# Patient Record
Sex: Female | Born: 1990 | Race: Black or African American | Hispanic: No | Marital: Single | Smoking: Never smoker
Health system: Southern US, Community
[De-identification: ages and names within clinical notes are randomized; demographics above are authoritative.]

## PROBLEM LIST (undated history)

## (undated) ENCOUNTER — Inpatient Hospital Stay (HOSPITAL_COMMUNITY): Payer: Self-pay

## (undated) DIAGNOSIS — D649 Anemia, unspecified: Secondary | ICD-10-CM

## (undated) HISTORY — PX: DILATION AND CURETTAGE OF UTERUS: SHX78

---
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
## 2010-04-27 ENCOUNTER — Inpatient Hospital Stay (HOSPITAL_COMMUNITY)
Admission: AD | Admit: 2010-04-27 | Discharge: 2010-04-27 | Disposition: A | Discharge status: Home / Self Care | Payer: Self-pay | Source: Ambulatory Visit | Attending: Obstetrics & Gynecology | Admitting: Obstetrics & Gynecology

## 2010-04-27 DIAGNOSIS — N760 Acute vaginitis: Secondary | ICD-10-CM | POA: Insufficient documentation

## 2010-04-27 DIAGNOSIS — N949 Unspecified condition associated with female genital organs and menstrual cycle: Secondary | ICD-10-CM

## 2010-04-27 LAB — POCT PREGNANCY, URINE: Preg Test, Ur: NEGATIVE

## 2010-04-27 LAB — WET PREP, GENITAL: Yeast Wet Prep HPF POC: NONE SEEN

## 2010-04-28 LAB — GC/CHLAMYDIA PROBE AMP, GENITAL
Chlamydia, DNA Probe: NEGATIVE
GC Probe Amp, Genital: NEGATIVE

## 2010-10-08 ENCOUNTER — Inpatient Hospital Stay (INDEPENDENT_AMBULATORY_CARE_PROVIDER_SITE_OTHER)
Admission: RE | Admit: 2010-10-08 | Discharge: 2010-10-08 | Disposition: A | Discharge status: Home / Self Care | Payer: Self-pay | Source: Ambulatory Visit | Attending: Family Medicine | Admitting: Family Medicine

## 2010-10-08 DIAGNOSIS — N3090 Cystitis, unspecified without hematuria: Secondary | ICD-10-CM

## 2010-10-08 LAB — POCT URINALYSIS DIP (DEVICE)
Bilirubin Urine: NEGATIVE
Glucose, UA: NEGATIVE mg/dL
Ketones, ur: NEGATIVE mg/dL
Nitrite: POSITIVE — AB

## 2010-10-08 LAB — POCT PREGNANCY, URINE: Preg Test, Ur: NEGATIVE

## 2010-10-17 ENCOUNTER — Inpatient Hospital Stay (INDEPENDENT_AMBULATORY_CARE_PROVIDER_SITE_OTHER)
Admission: RE | Admit: 2010-10-17 | Discharge: 2010-10-17 | Disposition: A | Discharge status: Home / Self Care | Payer: Self-pay | Source: Ambulatory Visit | Attending: Family Medicine | Admitting: Family Medicine

## 2010-10-17 DIAGNOSIS — N760 Acute vaginitis: Secondary | ICD-10-CM

## 2010-10-17 DIAGNOSIS — A499 Bacterial infection, unspecified: Secondary | ICD-10-CM

## 2010-10-17 LAB — POCT URINALYSIS DIP (DEVICE)
Bilirubin Urine: NEGATIVE
Glucose, UA: NEGATIVE mg/dL
Ketones, ur: NEGATIVE mg/dL
Nitrite: NEGATIVE
pH: 7 (ref 5.0–8.0)

## 2010-10-17 LAB — WET PREP, GENITAL

## 2010-10-20 LAB — GC/CHLAMYDIA PROBE AMP, GENITAL
Chlamydia, DNA Probe: POSITIVE — AB
GC Probe Amp, Genital: NEGATIVE

## 2011-05-06 ENCOUNTER — Encounter (HOSPITAL_COMMUNITY): Payer: Self-pay | Admitting: Emergency Medicine

## 2011-05-06 ENCOUNTER — Emergency Department (HOSPITAL_COMMUNITY)
Admission: EM | Admit: 2011-05-06 | Discharge: 2011-05-06 | Disposition: A | Discharge status: Home / Self Care | Payer: Self-pay | Attending: Emergency Medicine | Admitting: Emergency Medicine

## 2011-05-06 DIAGNOSIS — N760 Acute vaginitis: Secondary | ICD-10-CM | POA: Insufficient documentation

## 2011-05-06 DIAGNOSIS — A499 Bacterial infection, unspecified: Secondary | ICD-10-CM | POA: Insufficient documentation

## 2011-05-06 DIAGNOSIS — B9689 Other specified bacterial agents as the cause of diseases classified elsewhere: Secondary | ICD-10-CM | POA: Insufficient documentation

## 2011-05-06 LAB — URINALYSIS, ROUTINE W REFLEX MICROSCOPIC
Bilirubin Urine: NEGATIVE
Nitrite: NEGATIVE
Specific Gravity, Urine: 1.023 (ref 1.005–1.030)
Urobilinogen, UA: 1 mg/dL (ref 0.0–1.0)
pH: 6 (ref 5.0–8.0)

## 2011-05-06 LAB — POCT PREGNANCY, URINE: Preg Test, Ur: NEGATIVE

## 2011-05-06 LAB — URINE MICROSCOPIC-ADD ON

## 2011-05-06 LAB — WET PREP, GENITAL: Trich, Wet Prep: NONE SEEN

## 2011-05-06 MED ORDER — CEFTRIAXONE SODIUM 250 MG IJ SOLR
250.0000 mg | INTRAMUSCULAR | Status: DC
  Administered 2011-05-06: 250 mg via INTRAMUSCULAR
  Filled 2011-05-06: qty 250

## 2011-05-06 MED ORDER — LIDOCAINE HCL (PF) 1 % IJ SOLN
INTRAMUSCULAR | Status: AC
  Administered 2011-05-06: 06:00:00
  Filled 2011-05-06: qty 5

## 2011-05-06 MED ORDER — METRONIDAZOLE 500 MG PO TABS: 500.0000 mg | ORAL_TABLET | Freq: Two times a day (BID) | ORAL | Status: AC

## 2011-05-06 MED ORDER — AZITHROMYCIN 250 MG PO TABS
1000.0000 mg | ORAL_TABLET | Freq: Once | ORAL | Status: AC
  Administered 2011-05-06: 1000 mg via ORAL
  Filled 2011-05-06: qty 4

## 2011-05-06 NOTE — ED Notes (Signed)
PT. REPORTS VAGINAL DISCHARGE/ IRRITATION WITH STRONG ODOR FOR SEVERAL DAYS .

## 2011-05-06 NOTE — Discharge Instructions (Signed)
You have been treated in the emergency department to cover for potential gonorrhea or Chlamydia infection. The hospital will call you if your result comes back positive for gonorrhea or Chlamydia. Wet prep does show bacterial vaginosis. It is an overgrowth of your own natural bacteria within your vaginal vault. Take Flagyl as directed for bacterial vaginosis. Do not drink with Flagyl use or you will vomit. Followup with the [facility] STD clinic for future concerns of STD. Return to emergency department for emergent changing or worsening symptoms.  Bacterial Vaginosis Bacterial vaginosis (BV) is a vaginal infection where the normal balance of bacteria in the vagina is disrupted. The normal balance is then replaced by an overgrowth of certain bacteria. There are several different kinds of bacteria that can cause BV. BV is the most common vaginal infection in women of childbearing age. CAUSES   The cause of BV is not fully understood. BV develops when there is an increase or imbalance of harmful bacteria.   Some activities or behaviors can upset the normal balance of bacteria in the vagina and put women at increased risk including:   Having a new sex partner or multiple sex partners.   Douching.   Using an intrauterine device (IUD) for contraception.   It is not clear what role sexual activity plays in the development of BV. However, women that have never had sexual intercourse are rarely infected with BV.  Women do not get BV from toilet seats, bedding, swimming pools or from touching objects around them.  SYMPTOMS   Grey vaginal discharge.   A fish-like odor with discharge, especially after sexual intercourse.   Itching or burning of the vagina and vulva.   Burning or pain with urination.   Some women have no signs or symptoms at all.  DIAGNOSIS  Your caregiver must examine the vagina for signs of BV. Your caregiver will perform lab tests and look at the sample of vaginal fluid  through a microscope. They will look for bacteria and abnormal cells (clue cells), a pH test higher than 4.5, and a positive amine test all associated with BV.  RISKS AND COMPLICATIONS   Pelvic inflammatory disease (PID).   Infections following gynecology surgery.   Developing HIV.   Developing herpes virus.  TREATMENT  Sometimes BV will clear up without treatment. However, all women with symptoms of BV should be treated to avoid complications, especially if gynecology surgery is planned. Female partners generally do not need to be treated. However, BV may spread between female sex partners so treatment is helpful in preventing a recurrence of BV.   BV may be treated with antibiotics. The antibiotics come in either pill or vaginal cream forms. Either can be used with nonpregnant or pregnant women, but the recommended dosages differ. These antibiotics are not harmful to the baby.   BV can recur after treatment. If this happens, a second round of antibiotics will often be prescribed.   Treatment is important for pregnant women. If not treated, BV can cause a premature delivery, especially for a pregnant woman who had a premature birth in the past. All pregnant women who have symptoms of BV should be checked and treated.   For chronic reoccurrence of BV, treatment with a type of prescribed gel vaginally twice a week is helpful.  HOME CARE INSTRUCTIONS   Finish all medication as directed by your caregiver.   Do not have sex until treatment is completed.   Tell your sexual partner that you have a  vaginal infection. They should see their caregiver and be treated if they have problems, such as a mild rash or itching.   Practice safe sex. Use condoms. Only have 1 sex partner.  PREVENTION  Basic prevention steps can help reduce the risk of upsetting the natural balance of bacteria in the vagina and developing BV:  Do not have sexual intercourse (be abstinent).   Do not douche.   Use all of  the medicine prescribed for treatment of BV, even if the signs and symptoms go away.   Tell your sex partner if you have BV. That way, they can be treated, if needed, to prevent reoccurrence.  SEEK MEDICAL CARE IF:   Your symptoms are not improving after 3 days of treatment.   You have increased discharge, pain, or fever.  MAKE SURE YOU:   Understand these instructions.   Will watch your condition.   Will get help right away if you are not doing well or get worse.  FOR MORE INFORMATION  Division of STD Prevention (DSTDP), Centers for Disease Control and Prevention: [employer] American Social Health Association (ASHA): www.ashastd.org  Document Released: 01/12/2005 Document Revised: 01/01/2011 Document Reviewed: 07/05/2008 [facility] Patient Information 2012 [place], [place].

## 2011-05-06 NOTE — ED Provider Notes (Signed)
History     CSN: [id number]  Arrival date & time 05/06/11  6295   First MD Initiated Contact with Patient 05/06/11 [phone]      Chief Complaint  Patient presents with  . Vaginal Discharge    (Consider location/radiation/quality/duration/timing/severity/associated sxs/prior treatment) HPI  Patient presents to emergency department complaining of a one-week history of increased vaginal discharge with some vaginal irritation and a strong odor to the discharge. Patient denies any vaginal lesions. She denies abdominal pain, nausea, vomiting, diarrhea, dysuria, hematuria, or pelvic pain. Patient states she sexually active with multiple partners doesn't always use protection. Patient's concerned for potential STD. Patient is G0 P0. She denies fevers and denies chills. Patient denies history of STD. She denies dyspareunia. Symptoms are gradual onset, persistent, and unchanging. She's taken nothing for symptoms prior to arrival.  History reviewed. No pertinent past medical history.  History reviewed. No pertinent past surgical history.  No family history on file.  History  Substance Use Topics  . Smoking status: Never Smoker   . Smokeless tobacco: Not on file  . Alcohol Use: No    OB History    Grav Para Term Preterm Abortions TAB SAB Ect Mult Living                  Review of Systems  All other systems reviewed and are negative.    Allergies  Review of patient's allergies indicates no known allergies.  Home Medications  No current outpatient prescriptions on file.  BP 106/68  Pulse 73  Temp(Src) 98.4 F (36.9 C) (Oral)  Resp 19  SpO2 96%  LMP 04/17/2011  Physical Exam  Nursing note and vitals reviewed. Constitutional: She is oriented to person, place, and time. She appears well-developed and well-nourished. No distress.  HENT:  Head: Normocephalic and atraumatic.  Eyes: Conjunctivae are normal.  Neck: Normal range of motion. Neck supple.  Cardiovascular: Normal  rate, regular rhythm, normal heart sounds and intact distal pulses.  Exam reveals no gallop and no friction rub.   No murmur heard. Pulmonary/Chest: Effort normal and breath sounds normal. No respiratory distress. She has no wheezes. She has no rales. She exhibits no tenderness.  Abdominal: Soft. Bowel sounds are normal. She exhibits no distension and no mass. There is no tenderness. There is no rebound and no guarding.  Genitourinary: Vagina normal. There is no rash, tenderness or lesion on the right labia. There is no rash, tenderness or lesion on the left labia. Cervix exhibits discharge. Cervix exhibits no motion tenderness. Right adnexum displays no mass and no tenderness. Left adnexum displays no mass and no tenderness.       Moderate amount of malodorous white discharge from cervix  Musculoskeletal: Normal range of motion. She exhibits no edema and no tenderness.  Neurological: She is alert and oriented to person, place, and time.  Skin: Skin is warm and dry. No rash noted. She is not diaphoretic. No erythema.  Psychiatric: She has a normal mood and affect.    ED Course  Procedures (including critical care time)  IM rocephin and PO zithromax  Labs Reviewed  URINALYSIS, ROUTINE W REFLEX MICROSCOPIC - Abnormal; Notable for the following:    Leukocytes, UA SMALL (*)    All other components within normal limits  WET PREP, GENITAL - Abnormal; Notable for the following:    Clue Cells Wet Prep HPF POC MANY (*)    WBC, Wet Prep HPF POC MANY (*)    All other components within normal  limits  POCT PREGNANCY, URINE  URINE MICROSCOPIC-ADD ON  GC/CHLAMYDIA PROBE AMP, GENITAL   No results found.   1. Bacterial vaginosis       MDM  Patient is afebrile, nontoxic-appearing, abdomen soft nontender. No cervical motion tenderness or adnexal tenderness to palpation. Patient is at risk for STD given her sexual history with concern for a CD and with cervical discharge. Treated for gonorrhea and  Chlamydia in ER. No signs or symptoms of PID or TOA. Patient agreeable to following up with health department for further STD concerns        Jenness Corner, [place] 05/06/11 [phone]

## 2011-05-06 NOTE — ED Notes (Signed)
Pt to ED c/o white, "smelly" vaginal discharge x 1 week.  Pt denies abd/suprapubic/vaginal pain or pruritis.  States protected sexual activity 1 month prior.  LMPD 3 wks prior.

## 2011-05-06 NOTE — ED Provider Notes (Signed)
Medical screening examination/treatment/procedure(s) were performed by non-physician practitioner and as supervising physician I was immediately available for consultation/collaboration.  Jasmine Awe, MD 05/06/11 [phone]

## 2011-05-07 LAB — GC/CHLAMYDIA PROBE AMP, GENITAL: Chlamydia, DNA Probe: NEGATIVE

## 2013-01-14 ENCOUNTER — Encounter (HOSPITAL_COMMUNITY): Payer: Self-pay

## 2013-01-14 ENCOUNTER — Inpatient Hospital Stay (HOSPITAL_COMMUNITY)
Admission: AD | Admit: 2013-01-14 | Discharge: 2013-01-14 | Disposition: A | Discharge status: Home / Self Care | Payer: Medicaid Other | Source: Ambulatory Visit | Attending: Family Medicine | Admitting: Family Medicine

## 2013-01-14 DIAGNOSIS — M545 Low back pain, unspecified: Secondary | ICD-10-CM | POA: Insufficient documentation

## 2013-01-14 DIAGNOSIS — Z3202 Encounter for pregnancy test, result negative: Secondary | ICD-10-CM | POA: Insufficient documentation

## 2013-01-14 DIAGNOSIS — S39012A Strain of muscle, fascia and tendon of lower back, initial encounter: Secondary | ICD-10-CM

## 2013-01-14 DIAGNOSIS — S335XXA Sprain of ligaments of lumbar spine, initial encounter: Secondary | ICD-10-CM

## 2013-01-14 LAB — URINALYSIS, ROUTINE W REFLEX MICROSCOPIC
Glucose, UA: NEGATIVE mg/dL
Leukocytes, UA: NEGATIVE
Nitrite: NEGATIVE
pH: 6 (ref 5.0–8.0)

## 2013-01-14 MED ORDER — NORGESTIMATE-ETH ESTRADIOL 0.25-35 MG-MCG PO TABS: 1.0000 | ORAL_TABLET | Freq: Every day | ORAL | Status: DC

## 2013-01-14 NOTE — MAU Note (Signed)
Pt c/o lower back pain for several days now. Says that it feels like the pain she had when she last found out she was pregnant. States she had a period on December 8th, but it was a lot heavier than normal and she went through an entire box of tampons within 3-4 days.

## 2013-01-14 NOTE — MAU Provider Note (Signed)
History     CSN: [id number]  Arrival date and time: 01/14/13 1941   First Provider Initiated Contact with Patient 01/14/13 2011      Chief Complaint  Patient presents with  . Back Pain   HPI Brittney Mcfarland is a 22 y.o. G1P1001 presents with three-day history of right-sided back pain. Patient states this is similar to the first obtuse pregnant 1 be reevaluated. Patient testing Tylenol no improvement. Patient reports pain as 5/10 but is able to ambulate and sit up without issues. Patient has not had any urinary symptoms, constipation symptoms, fevers or chills.  Patient also reports that her period ended 1 week ago. Patient states that she was much heavier than usual and she went through 3 or 4 boxes of tampons as opposed worse usually 2 tampons/day. Patient currently is having unprotected sex but is not interested in conceiving a child.  OB History   Grav Para Term Preterm Abortions TAB SAB Ect Mult Living   1 1 0 1      1      History reviewed. No pertinent past medical history.  History reviewed. No pertinent past surgical history.  History reviewed. No pertinent family history.  History  Substance Use Topics  . Smoking status: Never Smoker   . Smokeless tobacco: Not on file  . Alcohol Use: No    Allergies: No Known Allergies  Prescriptions prior to admission  Medication Sig Dispense Refill  . acetaminophen (TYLENOL) 325 MG tablet Take 650 mg by mouth every 6 (six) hours as needed.      . Multiple Vitamin (MULTIVITAMIN) capsule Take 1 capsule by mouth daily.        ROS see above Physical Exam   Height 5\' 3"  (1.6 m), weight 80.196 kg (176 lb 12.8 oz), last menstrual period 01/02/2013.  Physical Exam  VSS NAD NO CVAT, no Suprapubic tenderness TTP along right Paraspinal. Improved with deep palpation and gentle massage. ~T4-T10. No c/c/e  Results for Brittney, Mcfarland (MRN [id number]) as of 01/14/2013 20:24  Ref. Range 01/14/2013 19:55 01/14/2013 20:11  Color, Urine  Latest Range: YELLOW  YELLOW   APPearance Latest Range: CLEAR  CLEAR   Specific Gravity, Urine Latest Range: 1.005-1.030  >1.030 (H)   pH Latest Range: 5.0-8.0  6.0   Glucose Latest Range: NEGATIVE mg/dL NEGATIVE   Bilirubin Urine Latest Range: NEGATIVE  NEGATIVE   Ketones, ur Latest Range: NEGATIVE mg/dL NEGATIVE   Protein Latest Range: NEGATIVE mg/dL NEGATIVE   Urobilinogen, UA Latest Range: 0.0-1.0 mg/dL 0.2   Nitrite Latest Range: NEGATIVE  NEGATIVE   Leukocytes, UA Latest Range: NEGATIVE  NEGATIVE   Hgb urine dipstick Latest Range: NEGATIVE  NEGATIVE   Preg Test, Ur Latest Range: NEGATIVE   NEGATIVE    MAU Course  Procedures  MDM Will check urine pregnancy test, and UA to evaluate for possible stone or infection.  Assessment and Plan  Brittney Mcfarland is a 22 y.o. G1P0101 presents paraspinal strain. Patient recommended to use heating pad, massage, Motrin 800 mg 3 times a day for 7 days. Encouraged take with food. No evidence of urinary tract infection or nephrolithiasis. Patient is not pregnant patient would like birth control. Patient does not have any contraindications. Patient denies migraine aura, history of DVTs, tobacco age of 24. Will give patient prescription for Sprintec and phone number for clinic to be followed up then. Patient would like long-term birth control may be a good candidate for the mirena.   Tawana Scale,  MD OB Fellow   Brittney Mcfarland, Audie Clear 01/14/2013, 8:18 PM

## 2013-01-15 NOTE — MAU Provider Note (Signed)
Chart reviewed and agree with management and plan.  

## 2013-05-01 ENCOUNTER — Inpatient Hospital Stay (HOSPITAL_COMMUNITY)
Admission: AD | Admit: 2013-05-01 | Discharge: 2013-05-01 | Discharge status: Against Medical Advice | Payer: Medicaid Other | Source: Ambulatory Visit | Attending: Obstetrics & Gynecology | Admitting: Obstetrics & Gynecology

## 2013-05-01 NOTE — MAU Note (Signed)
Patient is not in the lobby when called to triage.  

## 2013-05-01 NOTE — MAU Note (Signed)
Pt not in lobby at [employer]1952

## 2013-05-09 ENCOUNTER — Emergency Department (HOSPITAL_COMMUNITY)
Admission: EM | Admit: 2013-05-09 | Discharge: 2013-05-09 | Disposition: A | Discharge status: Home / Self Care | Payer: Medicaid Other | Source: Home / Self Care | Attending: Family Medicine | Admitting: Family Medicine

## 2013-05-09 ENCOUNTER — Encounter (HOSPITAL_COMMUNITY): Payer: Self-pay | Admitting: Emergency Medicine

## 2013-05-09 DIAGNOSIS — J329 Chronic sinusitis, unspecified: Secondary | ICD-10-CM

## 2013-05-09 MED ORDER — AMOXICILLIN-POT CLAVULANATE 875-125 MG PO TABS: 1.0000 | ORAL_TABLET | Freq: Two times a day (BID) | ORAL | Status: DC

## 2013-05-09 MED ORDER — METHYLPREDNISOLONE 4 MG PO KIT: PACK | ORAL | Status: DC

## 2013-05-09 MED ORDER — PSEUDOEPHEDRINE-IBUPROFEN 30-200 MG PO TABS: ORAL_TABLET | ORAL | Status: DC

## 2013-05-09 MED ORDER — FLUTICASONE PROPIONATE 50 MCG/ACT NA SUSP: 2.0000 | Freq: Two times a day (BID) | NASAL | Status: DC

## 2013-05-09 MED ORDER — OLOPATADINE HCL 0.2 % OP SOLN: OPHTHALMIC | Status: DC

## 2013-05-09 NOTE — ED Notes (Signed)
C/o sinus pressure and pain.  Itchy watery eyes. Sore throat.  Denies fever and any other symptoms.

## 2013-05-09 NOTE — ED Provider Notes (Signed)
CSN: [id number]     Arrival date & time 05/09/13  1348 History   First MD Initiated Contact with Patient 05/09/13 1451     Chief Complaint  Patient presents with  . Facial Pain   (Consider location/radiation/quality/duration/timing/severity/associated sxs/prior Treatment) HPI Comments: 23 year old female presents complaining of sinus pain and pressure, itchy watery eyes, sore throat, headache, present for 6 days. Her symptoms have been constant, not getting any better or worse. She has taken allergy medicine to try to treat this at home including Benadryl, Claritin, and Singulair. Nothing seems to be helping. She denies fever, chills, cough, shortness of breath, fatigue, NVD.   History reviewed. No pertinent past medical history. History reviewed. No pertinent past surgical history. History reviewed. No pertinent family history. History  Substance Use Topics  . Smoking status: Never Smoker   . Smokeless tobacco: Not on file  . Alcohol Use: No   OB History   Grav Para Term Preterm Abortions TAB SAB Ect Mult Living   1 1 0 1      1     Review of Systems  HENT: Positive for sinus pressure and sore throat.   Eyes: Positive for discharge and itching.  Neurological: Positive for headaches.  All other systems reviewed and are negative.   Allergies  Latex  Home Medications   Prior to Admission medications   Medication Sig Start Date End Date Taking? Authorizing Provider  acetaminophen (TYLENOL) 325 MG tablet Take 650 mg by mouth every 6 (six) hours as needed (back pain).    Historical Provider, MD  Multiple Vitamin (MULTIVITAMIN) capsule Take 1 capsule by mouth daily.    Historical Provider, MD  norgestimate-ethinyl estradiol (ORTHO-CYCLEN,SPRINTEC,PREVIFEM) 0.25-35 MG-MCG tablet Take 1 tablet by mouth daily. 01/14/13   Minta BalsamMichael R Odom, MD   BP 110/75  Pulse 82  Temp(Src) 97.7 F (36.5 C) (Oral)  Resp 16  SpO2 100%  LMP 04/22/2013 Physical Exam  Nursing note and vitals  reviewed. Constitutional: She is oriented to person, place, and time. Vital signs are normal. She appears well-developed and well-nourished. No distress.  HENT:  Head: Normocephalic and atraumatic.  Right Ear: Tympanic membrane, external ear and ear canal normal.  Left Ear: Tympanic membrane, external ear and ear canal normal.  Nose: Mucosal edema and rhinorrhea present. Right sinus exhibits maxillary sinus tenderness. Right sinus exhibits no frontal sinus tenderness. Left sinus exhibits maxillary sinus tenderness. Left sinus exhibits no frontal sinus tenderness.  Mouth/Throat: Uvula is midline, oropharynx is clear and moist and mucous membranes are normal.  Eyes:  Conjunctiva are glassy  Cardiovascular: Normal rate and regular rhythm.   Pulmonary/Chest: Effort normal. No respiratory distress.  Lymphadenopathy:    She has no cervical adenopathy.  Neurological: She is alert and oriented to person, place, and time. She has normal strength. Coordination normal.  Skin: Skin is warm and dry. No rash noted. She is not diaphoretic.  Psychiatric: She has a normal mood and affect. Judgment normal.    ED Course  Procedures (including critical care time) Labs Review Labs Reviewed - No data to display   Imaging Review No results found.   MDM   1. Rhinosinusitis    Probable allergies. Will treat accordingly. Postdated prescription for Augmentin. Followup when necessary if still not improving   Meds ordered this encounter  Medications  . methylPREDNISolone (MEDROL DOSEPAK) 4 MG tablet    Sig: Use as directed on package instructions    Dispense:  21 tablet    Refill:  0    Order Specific Question:  Supervising Provider    Answer:  Linna HoffKINDL, JAMES D [[id number]]  . fluticasone (FLONASE) 50 MCG/ACT nasal spray    Sig: Place 2 sprays into both nostrils 2 (two) times daily. Decrease to 2 sprays/nostril daily after 5 days    Dispense:  16 g    Refill:  2    Order Specific Question:  Supervising  Provider    Answer:  Linna HoffKINDL, JAMES D [phone][[id number]]  . Pseudoephedrine-Ibuprofen 30-200 MG TABS    Sig: 1-2 tabs PO Q6 hrs PRN    Dispense:  50 each    Refill:  1    Order Specific Question:  Supervising Provider    Answer:  Linna HoffKINDL, JAMES D [phone][[id number]]  . amoxicillin-clavulanate (AUGMENTIN) 875-125 MG per tablet    Sig: Take 1 tablet by mouth every 12 (twelve) hours.    Dispense:  14 tablet    Refill:  0    Order Specific Question:  Supervising Provider    Answer:  Linna HoffKINDL, JAMES D [phone][[id number]]  . Olopatadine HCl (PATADAY) 0.2 % SOLN    Sig: 1 drop per eye once daily as needed for redness, itching, or irritation    Dispense:  2.5 mL    Refill:  0    Order Specific Question:  Supervising Provider    Answer:  Bradd CanaryKINDL, JAMES D [[id number]]      Graylon GoodZachary H Garik Diamant, PA-C 05/09/13 [phone]

## 2013-05-09 NOTE — Discharge Instructions (Signed)

## 2013-05-12 NOTE — ED Provider Notes (Signed)
Medical screening examination/treatment/procedure(s) were performed by resident physician or non-physician practitioner and as supervising physician I was immediately available for consultation/collaboration.   Theora Vankirk DOUGLAS MD.   Loghan Kurtzman D Nakeitha Milligan, MD 05/12/13 1223 

## 2013-07-14 ENCOUNTER — Emergency Department (HOSPITAL_COMMUNITY): Payer: No Typology Code available for payment source

## 2013-07-14 ENCOUNTER — Emergency Department (HOSPITAL_COMMUNITY)
Admission: EM | Admit: 2013-07-14 | Discharge: 2013-07-14 | Disposition: A | Discharge status: Home / Self Care | Payer: No Typology Code available for payment source | Attending: Emergency Medicine | Admitting: Emergency Medicine

## 2013-07-14 ENCOUNTER — Encounter (HOSPITAL_COMMUNITY): Payer: Self-pay | Admitting: Emergency Medicine

## 2013-07-14 DIAGNOSIS — S63502A Unspecified sprain of left wrist, initial encounter: Secondary | ICD-10-CM

## 2013-07-14 DIAGNOSIS — Z91040 Latex allergy status: Secondary | ICD-10-CM | POA: Insufficient documentation

## 2013-07-14 DIAGNOSIS — Y92410 Unspecified street and highway as the place of occurrence of the external cause: Secondary | ICD-10-CM | POA: Insufficient documentation

## 2013-07-14 DIAGNOSIS — Z79899 Other long term (current) drug therapy: Secondary | ICD-10-CM | POA: Insufficient documentation

## 2013-07-14 DIAGNOSIS — S335XXA Sprain of ligaments of lumbar spine, initial encounter: Secondary | ICD-10-CM | POA: Insufficient documentation

## 2013-07-14 DIAGNOSIS — IMO0002 Reserved for concepts with insufficient information to code with codable children: Secondary | ICD-10-CM | POA: Insufficient documentation

## 2013-07-14 DIAGNOSIS — S39012A Strain of muscle, fascia and tendon of lower back, initial encounter: Secondary | ICD-10-CM

## 2013-07-14 DIAGNOSIS — Z792 Long term (current) use of antibiotics: Secondary | ICD-10-CM | POA: Insufficient documentation

## 2013-07-14 DIAGNOSIS — Y9389 Activity, other specified: Secondary | ICD-10-CM | POA: Insufficient documentation

## 2013-07-14 DIAGNOSIS — S63509A Unspecified sprain of unspecified wrist, initial encounter: Secondary | ICD-10-CM | POA: Insufficient documentation

## 2013-07-14 MED ORDER — DIAZEPAM 5 MG PO TABS: 5.0000 mg | ORAL_TABLET | Freq: Two times a day (BID) | ORAL | Status: DC

## 2013-07-14 MED ORDER — TRAMADOL HCL 50 MG PO TABS: 50.0000 mg | ORAL_TABLET | Freq: Four times a day (QID) | ORAL | Status: DC | PRN

## 2013-07-14 NOTE — ED Notes (Signed)
Splint applied to left wrist. CMS intact. Pt given discharge instructions. All questions identified.
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
## 2013-07-19 NOTE — ED Provider Notes (Signed)
Medical screening examination/treatment/procedure(s) were performed by non-physician practitioner and as supervising physician I was immediately available for consultation/collaboration.   EKG Interpretation None        Rolland PorterMark James, MD 07/19/13 [phone]

## 2013-11-27 ENCOUNTER — Encounter (HOSPITAL_COMMUNITY): Payer: Self-pay | Admitting: Emergency Medicine

## 2014-06-13 ENCOUNTER — Encounter (HOSPITAL_COMMUNITY): Payer: Self-pay | Admitting: Emergency Medicine

## 2014-06-13 ENCOUNTER — Emergency Department (HOSPITAL_COMMUNITY)
Admission: EM | Admit: 2014-06-13 | Discharge: 2014-06-13 | Disposition: A | Discharge status: Home / Self Care | Payer: Medicaid Other | Attending: Emergency Medicine | Admitting: Emergency Medicine

## 2014-06-13 DIAGNOSIS — Z7951 Long term (current) use of inhaled steroids: Secondary | ICD-10-CM | POA: Insufficient documentation

## 2014-06-13 DIAGNOSIS — Z79899 Other long term (current) drug therapy: Secondary | ICD-10-CM | POA: Insufficient documentation

## 2014-06-13 DIAGNOSIS — Z91040 Latex allergy status: Secondary | ICD-10-CM | POA: Insufficient documentation

## 2014-06-13 DIAGNOSIS — H00013 Hordeolum externum right eye, unspecified eyelid: Secondary | ICD-10-CM

## 2014-06-13 DIAGNOSIS — H00011 Hordeolum externum right upper eyelid: Secondary | ICD-10-CM | POA: Insufficient documentation

## 2014-06-13 DIAGNOSIS — Z792 Long term (current) use of antibiotics: Secondary | ICD-10-CM | POA: Insufficient documentation

## 2014-06-13 MED ORDER — BACITRACIN ZINC 500 UNIT/GM EX OINT
TOPICAL_OINTMENT | CUTANEOUS | Status: AC
  Administered 2014-06-13: 16:00:00
  Filled 2014-06-13: qty 0.9

## 2014-06-13 MED ORDER — MUPIROCIN CALCIUM 2 % EX CREA: TOPICAL_CREAM | CUTANEOUS | Status: DC

## 2014-06-13 NOTE — ED Notes (Signed)
Pt has what appears to be a reddened swollen bump on right eyelid. Says "it just appeared 2 days ago." Denies any other c/c. Pain when opening eyelid.

## 2014-06-13 NOTE — ED Provider Notes (Signed)
CSN: [id number]     Arrival date & time 06/13/14  1433 History   This chart was scribed for non-physician practitioner Vangie BickerErin O' [place] MorelleMalley, PA-C, working with Mirian MoMatthew Gentry, MD, by Andrew Auaven Small, ED Scribe. This patient was seen in room WTR5/WTR5 and the patient's care was started at 3:06 PM.  Chief Complaint  Patient presents with  . Eye Pain  . Eyelid wound    The history is provided by the patient. No language interpreter was used.   Brittney Mcfarland is a 24 y.o. female who presents to the Emergency Department complaining of right upper eyelid pain. Pt has a small erythematous bump to right eyelid. Pt reports associated blurry vision and HA. Pt has applied warm compresses. Pt denies wearing contacts. She denies fevers. Denies nasal congestion or cough. No hx of similar symptoms.  History reviewed. No pertinent past medical history. History reviewed. No pertinent past surgical history. History reviewed. No pertinent family history. History  Substance Use Topics  . Smoking status: Never Smoker   . Smokeless tobacco: Not on file  . Alcohol Use: No   OB History    Gravida Para Term Preterm AB TAB SAB Ectopic Multiple Living   1 1 0 1      1     Review of Systems  Constitutional: Negative for fever.  Eyes: Positive for pain and visual disturbance.  Neurological: Positive for headaches.   Allergies  Latex  Home Medications   Prior to Admission medications   Medication Sig Start Date End Date Taking? Authorizing Provider  acetaminophen (TYLENOL) 325 MG tablet Take 650 mg by mouth every 6 (six) hours as needed (back pain).    Historical Provider, MD  amoxicillin-clavulanate (AUGMENTIN) 875-125 MG per tablet Take 1 tablet by mouth every 12 (twelve) hours. 05/09/13   Adrian BlackwaterZachary H Baker, PA-C  diazepam (VALIUM) 5 MG tablet Take 1 tablet (5 mg total) by mouth 2 (two) times daily. 07/14/13   Robyn M Hess, PA-C  fluticasone (FLONASE) 50 MCG/ACT nasal spray Place 2 sprays into both nostrils 2 (two)  times daily. Decrease to 2 sprays/nostril daily after 5 days 05/09/13   Graylon GoodZachary H Baker, PA-C  methylPREDNISolone (MEDROL DOSEPAK) 4 MG tablet Use as directed on package instructions 05/09/13   Graylon GoodZachary H Baker, PA-C  Multiple Vitamin (MULTIVITAMIN) capsule Take 1 capsule by mouth daily.    Historical Provider, MD  mupirocin cream (BACTROBAN) 2 % Apply thin layer to upper eyelid twice daily for up to 5 days 06/13/14   Junius FinnerErin O'Malley, PA-C  norgestimate-ethinyl estradiol (ORTHO-CYCLEN,SPRINTEC,PREVIFEM) 0.25-35 MG-MCG tablet Take 1 tablet by mouth daily. 01/14/13   Minta BalsamMichael R Odom, MD  Olopatadine HCl (PATADAY) 0.2 % SOLN 1 drop per eye once daily as needed for redness, itching, or irritation 05/09/13   Graylon GoodZachary H Baker, PA-C  Pseudoephedrine-Ibuprofen 30-200 MG TABS 1-2 tabs PO Q6 hrs PRN 05/09/13   Graylon GoodZachary H Baker, PA-C  traMADol (ULTRAM) 50 MG tablet Take 1 tablet (50 mg total) by mouth every 6 (six) hours as needed. 07/14/13   Robyn M Hess, PA-C   BP 113/71 mmHg  Pulse 68  Temp(Src) 98.2 F (36.8 C) (Oral)  Resp 16  SpO2 100% Physical Exam  Constitutional: She is oriented to person, place, and time. She appears well-developed and well-nourished. No distress.  HENT:  Head: Normocephalic and atraumatic.  Eyes: Conjunctivae and EOM are normal. Pupils are equal, round, and reactive to light. Right eye exhibits hordeolum. Right eye exhibits no chemosis, no discharge and  no exudate. No foreign body present in the right eye. Left eye exhibits no chemosis, no discharge, no exudate and no hordeolum. No foreign body present in the left eye. Right conjunctiva is not injected. Right conjunctiva has no hemorrhage. Left conjunctiva is not injected. Left conjunctiva has no hemorrhage.  Right upper eyelid, lateral aspect: 0.5cm area of erythema with centralized area of induration and whitehead.  No active drainage or bleeding. No periorbital edema or tenderness.  PERRL, EOM normal.  Neck: Neck supple.   Cardiovascular: Normal rate.   Pulmonary/Chest: Effort normal.  Musculoskeletal: Normal range of motion.  Neurological: She is alert and oriented to person, place, and time.  Skin: Skin is warm and dry.  Psychiatric: She has a normal mood and affect. Her behavior is normal.  Nursing note and vitals reviewed.   ED Course  Procedures (including critical care time) INCISION AND DRAINAGE PROCEDURE NOTE: Patient identification was confirmed and verbal consent was obtained. This procedure was performed by Vangie BickerErin O' Malley at 3:15 PM. Site: right eyelid Sterile procedures observed: yes Needle size: 18 gauge needle Anesthetic used (type and amt): none Drainage: purulent bloody drainage Complexity: simple Packing used none  Site anesthetized, incision made over site, wound drained and explored loculations, rinsed with copious amounts of normal saline, wound packed with sterile gauze, covered with dry, sterile dressing.  Pt tolerated procedure well without complications.  Instructions for care discussed verbally and pt provided with additional written instructions for homecare and f/u.  COORDINATION OF CARE: 3:15 PM- Pt advised of plan for treatment witch includes I&D and pt agrees.  Labs Review Labs Reviewed - No data to display  Imaging Review No results found.   EKG Interpretation None      MDM   Final diagnoses:  Hordeolum external, right    Pt is a 24yo female presenting to ED with c/o Right upper eyelid pain, swelling and redness. No active discharge. Exam c/o hordeolum.  No periorbital edema or tenderness. No evidence of periorbital cellulitis.  Pt is afebrile.  Marvel PlanWhitehead was able to be "deroofed" scant purulent discharge present. Bacitracin applied.  Rx: mupirocin. Home care instructions provided. Pt verbalized understanding and agreement with tx plan.   I personally performed the services described in this documentation, which was scribed in my presence. The recorded  information has been reviewed and is accurate.   Junius Finnerrin O'Malley, PA-C 06/13/14 1527  Mirian MoMatthew Gentry, MD 06/15/14 0830

## 2014-07-03 ENCOUNTER — Encounter (HOSPITAL_COMMUNITY): Payer: Self-pay | Admitting: Emergency Medicine

## 2014-07-03 ENCOUNTER — Emergency Department (HOSPITAL_COMMUNITY)
Admission: EM | Admit: 2014-07-03 | Discharge: 2014-07-03 | Disposition: A | Discharge status: Home / Self Care | Payer: Medicaid Other | Attending: Emergency Medicine | Admitting: Emergency Medicine

## 2014-07-03 ENCOUNTER — Emergency Department (HOSPITAL_COMMUNITY): Payer: Medicaid Other

## 2014-07-03 DIAGNOSIS — N8320 Unspecified ovarian cysts: Secondary | ICD-10-CM | POA: Insufficient documentation

## 2014-07-03 DIAGNOSIS — Z91040 Latex allergy status: Secondary | ICD-10-CM | POA: Insufficient documentation

## 2014-07-03 DIAGNOSIS — R102 Pelvic and perineal pain: Secondary | ICD-10-CM

## 2014-07-03 DIAGNOSIS — Z7952 Long term (current) use of systemic steroids: Secondary | ICD-10-CM | POA: Insufficient documentation

## 2014-07-03 DIAGNOSIS — Z79899 Other long term (current) drug therapy: Secondary | ICD-10-CM | POA: Insufficient documentation

## 2014-07-03 DIAGNOSIS — N760 Acute vaginitis: Secondary | ICD-10-CM | POA: Insufficient documentation

## 2014-07-03 DIAGNOSIS — B9689 Other specified bacterial agents as the cause of diseases classified elsewhere: Secondary | ICD-10-CM

## 2014-07-03 DIAGNOSIS — N83201 Unspecified ovarian cyst, right side: Secondary | ICD-10-CM

## 2014-07-03 DIAGNOSIS — Z3202 Encounter for pregnancy test, result negative: Secondary | ICD-10-CM | POA: Insufficient documentation

## 2014-07-03 LAB — URINALYSIS, ROUTINE W REFLEX MICROSCOPIC
Bilirubin Urine: NEGATIVE
GLUCOSE, UA: NEGATIVE mg/dL
Hgb urine dipstick: NEGATIVE
KETONES UR: NEGATIVE mg/dL
Nitrite: NEGATIVE
PROTEIN: NEGATIVE mg/dL
Specific Gravity, Urine: 1.011 (ref 1.005–1.030)
UROBILINOGEN UA: 0.2 mg/dL (ref 0.0–1.0)
pH: 7.5 (ref 5.0–8.0)

## 2014-07-03 LAB — URINE MICROSCOPIC-ADD ON

## 2014-07-03 LAB — WET PREP, GENITAL
Trich, Wet Prep: NONE SEEN
YEAST WET PREP: NONE SEEN

## 2014-07-03 LAB — POC URINE PREG, ED: PREG TEST UR: NEGATIVE

## 2014-07-03 LAB — GC/CHLAMYDIA PROBE AMP (~~LOC~~) NOT AT ARMC
CHLAMYDIA, DNA PROBE: NEGATIVE
NEISSERIA GONORRHEA: NEGATIVE

## 2014-07-03 MED ORDER — METRONIDAZOLE 500 MG PO TABS: 500.0000 mg | ORAL_TABLET | Freq: Two times a day (BID) | ORAL | Status: DC

## 2014-07-03 MED ORDER — HYDROCODONE-ACETAMINOPHEN 5-325 MG PO TABS: 1.0000 | ORAL_TABLET | Freq: Four times a day (QID) | ORAL | Status: DC | PRN

## 2014-07-03 NOTE — ED Notes (Signed)
Patient requesting pain medicine before she leaves.

## 2014-07-03 NOTE — ED Provider Notes (Signed)
CSN: [id number]     Arrival date & time 07/03/14  0131 History   First MD Initiated Contact with Patient 07/03/14 [phone]     Chief Complaint  Patient presents with  . Abdominal Pain     (Consider location/radiation/quality/duration/timing/severity/associated sxs/prior Treatment) HPI  This is a 24 year old female with a two-day history of pelvic pain. She describes the pain as feeling like her female parts are going to fall out. She states the pain is "really bad" and worse with walking or palpation. She has had associated left flank pain, nausea but no vomiting, and vaginal discharge. She denies vaginal bleeding. She denies diarrhea. She denies fever or chills.  History reviewed. No pertinent past medical history. History reviewed. No pertinent past surgical history. History reviewed. No pertinent family history. History  Substance Use Topics  . Smoking status: Never Smoker   . Smokeless tobacco: Not on file  . Alcohol Use: Yes     Comment: social   OB History    Gravida Para Term Preterm AB TAB SAB Ectopic Multiple Living   1 1 0 1      1     Review of Systems  All other systems reviewed and are negative.   Allergies  Latex  Home Medications   Prior to Admission medications   Medication Sig Start Date End Date Taking? Authorizing Provider  acetaminophen (TYLENOL) 325 MG tablet Take 650 mg by mouth every 6 (six) hours as needed (back pain).   Yes Historical Provider, MD  amoxicillin-clavulanate (AUGMENTIN) 875-125 MG per tablet Take 1 tablet by mouth every 12 (twelve) hours. Patient not taking: Reported on 07/03/2014 05/09/13   Graylon GoodZachary H Baker, PA-C  diazepam (VALIUM) 5 MG tablet Take 1 tablet (5 mg total) by mouth 2 (two) times daily. Patient not taking: Reported on 07/03/2014 07/14/13   Nada Boozerobyn M Hess, PA-C  fluticasone [facility]FLONASE) 50 MCG/ACT nasal spray Place 2 sprays into both nostrils 2 (two) times daily. Decrease to 2 sprays/nostril daily after 5 days Patient not taking:  Reported on 07/03/2014 05/09/13   Graylon GoodZachary H Baker, PA-C  methylPREDNISolone (MEDROL DOSEPAK) 4 MG tablet Use as directed on package instructions Patient not taking: Reported on 07/03/2014 05/09/13   Graylon GoodZachary H Baker, PA-C  mupirocin cream (BACTROBAN) 2 % Apply thin layer to upper eyelid twice daily for up to 5 days Patient not taking: Reported on 07/03/2014 06/13/14   Junius FinnerErin O'Malley, PA-C  norgestimate-ethinyl estradiol (ORTHO-CYCLEN,SPRINTEC,PREVIFEM) 0.25-35 MG-MCG tablet Take 1 tablet by mouth daily. Patient not taking: Reported on 07/03/2014 01/14/13   Minta BalsamMichael R Odom, MD  Olopatadine HCl (PATADAY) 0.2 % SOLN 1 drop per eye once daily as needed for redness, itching, or irritation Patient not taking: Reported on 07/03/2014 05/09/13   Graylon GoodZachary H Baker, PA-C  Pseudoephedrine-Ibuprofen 30-200 MG TABS 1-2 tabs PO Q6 hrs PRN Patient not taking: Reported on 07/03/2014 05/09/13   Graylon GoodZachary H Baker, PA-C  traMADol (ULTRAM) 50 MG tablet Take 1 tablet (50 mg total) by mouth every 6 (six) hours as needed. Patient not taking: Reported on 07/03/2014 07/14/13   Robyn M Hess, PA-C   BP 130/79 mmHg  Pulse 67  Temp(Src) 98.6 F (37 C) (Oral)  Resp 19  Wt 180 lb (81.647 kg)  SpO2 100%  LMP 06/21/2014 (Exact Date)   Physical Exam  General: Well-developed, well-nourished female in no acute distress; appearance consistent with age of record HENT: normocephalic; atraumatic Eyes: pupils equal, round and reactive to light; extraocular muscles intact Neck: supple Heart: regular  rate and rhythm Lungs: clear to auscultation bilaterally Abdomen: soft; nondistended; suprapubic tenderness; no masses or hepatosplenomegaly; bowel sounds present GU: No CVA tenderness; normal external genitalia; mucoid vaginal discharge with abnormal vaginal odor; no vaginal bleeding; right adnexal tenderness; cervical motion tenderness Extremities: No deformity; full range of motion; pulses normal Neurologic: Awake, alert and oriented; motor function  intact in all extremities and symmetric; no facial droop Skin: Warm and dry Psychiatric: Normal mood and affect    ED Course  Procedures (including critical care time)   MDM   Nursing notes and vitals signs, including pulse oximetry, reviewed.  Summary of this visit's results, reviewed by myself:  Labs:  Results for orders placed or performed during the hospital encounter of 15-Jul-2014 (from the past 24 hour(s))  Urinalysis, Routine w reflex microscopic-may I&O cath if menses (not at [facility])     Status: Abnormal   Collection Time: Jul 15, 2014  1:54 AM  Result Value Ref Range   Color, Urine YELLOW YELLOW   APPearance CLEAR CLEAR   Specific Gravity, Urine 1.011 1.005 - 1.030   pH 7.5 5.0 - 8.0   Glucose, UA NEGATIVE NEGATIVE mg/dL   Hgb urine dipstick NEGATIVE NEGATIVE   Bilirubin Urine NEGATIVE NEGATIVE   Ketones, ur NEGATIVE NEGATIVE mg/dL   Protein, ur NEGATIVE NEGATIVE mg/dL   Urobilinogen, UA 0.2 0.0 - 1.0 mg/dL   Nitrite NEGATIVE NEGATIVE   Leukocytes, UA TRACE (A) NEGATIVE  Urine microscopic-add on     Status: None   Collection Time: 2014-07-15  1:54 AM  Result Value Ref Range   Squamous Epithelial / LPF RARE RARE   WBC, UA 0-2 <3 WBC/hpf  POC Urine Pregnancy, ED (if pre-menopausal female) not at [facility]     Status: None   Collection Time: 2014/07/15  1:59 AM  Result Value Ref Range   Preg Test, Ur NEGATIVE NEGATIVE  Wet prep, genital     Status: Abnormal   Collection Time: 07-15-14  5:21 AM  Result Value Ref Range   Yeast Wet Prep HPF POC NONE SEEN NONE SEEN   Trich, Wet Prep NONE SEEN NONE SEEN   Clue Cells Wet Prep HPF POC FEW (A) NONE SEEN   WBC, Wet Prep HPF POC MODERATE (A) NONE SEEN    Imaging Studies: US Transvaginal Non-ob  2014/07/15   CLINICAL DATA:  Pelvic pain.  Negative pregnancy test.  EXAM: TRANSABDOMINAL AND TRANSVAGINAL ULTRASOUND OF PELVIS  TECHNIQUE: Both transabdominal and transvaginal ultrasound examinations of the pelvis were performed. Transabdominal  technique was performed for global imaging of the pelvis including uterus, ovaries, adnexal regions, and pelvic cul-de-sac. It was necessary to proceed with endovaginal exam following the transabdominal exam to visualize the uterus and ovaries.  COMPARISON:  None  FINDINGS: Uterus  Measurements: 8.5 x 3.6 x 5.9 cm. 1.2 cm intramural fibroid anterior fundus.  Endometrium  Thickness: 11 mm.  No focal abnormality visualized.  Right ovary  Measurements: 4.1 x 2.1 x 3.3 cm. A a small 2.4 x 1.7 x 2.1 cm cyst with an adjacent or with a internal tiny 5 mm cyst is noted.  Left ovary  Measurements: 2.4 x 1.7 x 2.5 cm. Normal appearance/no adnexal mass.  Other findings  Trace free pelvic fluid.  IMPRESSION: 1. 2.4 cm cyst containing 5 mm cyst in the right ovary. This is almost certainly benign, and no specific imaging follow up is recommended according to the Society of Radiologists in Ultrasound2010 Consensus Conference Statement (D Lavonda Jumbo al. Management of Asymptomatic  Ovarian and Other Adnexal Cysts Imaged at [place]: Society of Radiologists in Ultrasound Consensus Conference Statement 2010. Radiology 256 (Sept 2010): 943-954.). 2. Small amount of free pelvic fluid. 3. Small 1.2 cm intramural fibroid anterior fundus of the uterus.   Electronically Signed   By: Maisie Fus  Register   On: 07/03/2014 07:36   US Pelvis Complete  07/03/2014   CLINICAL DATA:  Pelvic pain.  Negative pregnancy test.  EXAM: TRANSABDOMINAL AND TRANSVAGINAL ULTRASOUND OF PELVIS  TECHNIQUE: Both transabdominal and transvaginal ultrasound examinations of the pelvis were performed. Transabdominal technique was performed for global imaging of the pelvis including uterus, ovaries, adnexal regions, and pelvic cul-de-sac. It was necessary to proceed with endovaginal exam following the transabdominal exam to visualize the uterus and ovaries.  COMPARISON:  None  FINDINGS: Uterus  Measurements: 8.5 x 3.6 x 5.9 cm. 1.2 cm intramural fibroid anterior fundus.   Endometrium  Thickness: 11 mm.  No focal abnormality visualized.  Right ovary  Measurements: 4.1 x 2.1 x 3.3 cm. A a small 2.4 x 1.7 x 2.1 cm cyst with an adjacent or with a internal tiny 5 mm cyst is noted.  Left ovary  Measurements: 2.4 x 1.7 x 2.5 cm. Normal appearance/no adnexal mass.  Other findings  Trace free pelvic fluid.  IMPRESSION: 1. 2.4 cm cyst containing 5 mm cyst in the right ovary. This is almost certainly benign, and no specific imaging follow up is recommended according to the Society of Radiologists in Ultrasound2010 Consensus Conference Statement (D Lenis Noon et al. Management of Asymptomatic Ovarian and Other Adnexal Cysts Imaged at [place]: Society of Radiologists in Ultrasound Consensus Conference Statement 2010. Radiology 256 (Sept 2010): 943-954.). 2. Small amount of free pelvic fluid. 3. Small 1.2 cm intramural fibroid anterior fundus of the uterus.   Electronically Signed   By: Maisie Fus  Register   On: 07/03/2014 07:36   7:42 AM Patient advised of ultrasound findings. She does have an OB/GYN, Dr. Tenny Craw. We will treat her pain we'll treat for BV given abnormal vaginal odor despite no significant changes on wet prep.    Paula Libra, MD 07/03/14 [phone]

## 2014-07-03 NOTE — ED Notes (Signed)
Pt is c/o pain to her lower abd/pelvic area and pain in her left flank area  Pt states pain started Sunday night

## 2014-07-03 NOTE — ED Notes (Signed)
Patient went to ultrasound.

## 2014-07-14 ENCOUNTER — Encounter (HOSPITAL_COMMUNITY): Payer: Self-pay | Admitting: *Deleted

## 2014-07-14 ENCOUNTER — Emergency Department (HOSPITAL_COMMUNITY)
Admission: EM | Admit: 2014-07-14 | Discharge: 2014-07-14 | Disposition: A | Discharge status: Home / Self Care | Payer: PRIVATE HEALTH INSURANCE | Source: Home / Self Care | Attending: Family Medicine | Admitting: Family Medicine

## 2014-07-14 DIAGNOSIS — L03116 Cellulitis of left lower limb: Secondary | ICD-10-CM

## 2014-07-14 MED ORDER — CEPHALEXIN 500 MG PO CAPS: 500.0000 mg | ORAL_CAPSULE | Freq: Four times a day (QID) | ORAL | Status: DC

## 2014-07-14 NOTE — ED Notes (Signed)
l  Foot pain  That   Started   Last  Pm   denys  Any  ijury     Hurts  To  Bear   Weight

## 2014-07-14 NOTE — ED Provider Notes (Signed)
CSN: [id number]     Arrival date & time 07/14/14  1609 History   First MD Initiated Contact with Patient 07/14/14 1656     No chief complaint on file.  (Consider location/radiation/quality/duration/timing/severity/associated sxs/prior Treatment) Patient is a 24 y.o. female presenting with lower extremity pain. The history is provided by the patient.  Foot Pain This is a new problem. The current episode started yesterday. The problem has not changed since onset.The symptoms are aggravated by walking.    No past medical history on file. No past surgical history on file. No family history on file. History  Substance Use Topics  . Smoking status: Never Smoker   . Smokeless tobacco: Not on file  . Alcohol Use: Yes     Comment: social   OB History    Gravida Para Term Preterm AB TAB SAB Ectopic Multiple Living   1 1 0 1      1     Review of Systems  Constitutional: Negative.   Musculoskeletal: Positive for gait problem. Negative for joint swelling.  Skin: Positive for rash. Negative for wound.    Allergies  Latex  Home Medications   Prior to Admission medications   Medication Sig Start Date End Date Taking? Authorizing Provider  acetaminophen (TYLENOL) 325 MG tablet Take 650 mg by mouth every 6 (six) hours as needed (back pain).    Historical Provider, MD  cephALEXin (KEFLEX) 500 MG capsule Take 1 capsule (500 mg total) by mouth 4 (four) times daily. Take all of medicine and drink lots of fluids 07/14/14   Linna Hoff, MD  HYDROcodone-acetaminophen (NORCO) 5-325 MG per tablet Take 1-2 tablets by mouth every 6 (six) hours as needed (for pain). 07/03/14   John Molpus, MD  metroNIDAZOLE (FLAGYL) 500 MG tablet Take 1 tablet (500 mg total) by mouth 2 (two) times daily. One po bid x 7 days 07/03/14   Paula Libra, MD   BP 116/70 mmHg  Pulse 98  Temp(Src) 98.8 F (37.1 C) (Oral)  Resp 12  SpO2 100%  LMP 06/21/2014 (Exact Date) Physical Exam  Constitutional: She is oriented to  person, place, and time. She appears well-developed and well-nourished.  Musculoskeletal: She exhibits tenderness.  Tender erythematous area to leftmedial arch of left foot, no drainage, no foot sts.  Neurological: She is alert and oriented to person, place, and time.  Skin: Skin is warm and dry. No rash noted. There is erythema. No pallor.  Nursing note and vitals reviewed.   ED Course  Procedures (including critical care time) Labs Review Labs Reviewed - No data to display  Imaging Review No results found.   MDM   1. Cellulitis of foot, left        Linna Hoff, MD 07/14/14 1714

## 2014-07-14 NOTE — Discharge Instructions (Signed)
Warm soak 4 times a day when you take the antibiotic, take all of medicine, return as needed.

## 2014-07-18 ENCOUNTER — Emergency Department (HOSPITAL_COMMUNITY)
Admission: EM | Admit: 2014-07-18 | Discharge: 2014-07-18 | Disposition: A | Discharge status: Home / Self Care | Payer: PRIVATE HEALTH INSURANCE | Attending: Emergency Medicine | Admitting: Emergency Medicine

## 2014-07-18 ENCOUNTER — Encounter (HOSPITAL_COMMUNITY): Payer: Self-pay | Admitting: Emergency Medicine

## 2014-07-18 DIAGNOSIS — Y9289 Other specified places as the place of occurrence of the external cause: Secondary | ICD-10-CM | POA: Diagnosis not present

## 2014-07-18 DIAGNOSIS — R21 Rash and other nonspecific skin eruption: Secondary | ICD-10-CM | POA: Diagnosis not present

## 2014-07-18 DIAGNOSIS — Y998 Other external cause status: Secondary | ICD-10-CM | POA: Diagnosis not present

## 2014-07-18 DIAGNOSIS — X58XXXA Exposure to other specified factors, initial encounter: Secondary | ICD-10-CM | POA: Insufficient documentation

## 2014-07-18 DIAGNOSIS — Y9389 Activity, other specified: Secondary | ICD-10-CM | POA: Diagnosis not present

## 2014-07-18 DIAGNOSIS — T7840XA Allergy, unspecified, initial encounter: Secondary | ICD-10-CM | POA: Insufficient documentation

## 2014-07-18 DIAGNOSIS — Z91040 Latex allergy status: Secondary | ICD-10-CM | POA: Insufficient documentation

## 2014-07-18 MED ORDER — FAMOTIDINE 20 MG PO TABS
20.0000 mg | ORAL_TABLET | Freq: Two times a day (BID) | ORAL | Status: DC
Start: 2014-07-18 — End: 2014-10-09

## 2014-07-18 MED ORDER — PREDNISONE 10 MG PO TABS: 20.0000 mg | ORAL_TABLET | Freq: Every day | ORAL | Status: DC

## 2014-07-18 MED ORDER — PREDNISONE 20 MG PO TABS
60.0000 mg | ORAL_TABLET | Freq: Once | ORAL | Status: AC
  Administered 2014-07-18: 60 mg via ORAL
  Filled 2014-07-18: qty 3

## 2014-07-18 MED ORDER — FAMOTIDINE 20 MG PO TABS
20.0000 mg | ORAL_TABLET | Freq: Once | ORAL | Status: AC
  Administered 2014-07-18: 20 mg via ORAL
  Filled 2014-07-18: qty 1

## 2014-07-18 NOTE — Discharge Instructions (Signed)

## 2014-07-18 NOTE — ED Notes (Signed)
Pt. reports generalized itchy rashes with hives onset last Friday unrelieved by oral Benadryl , respirations unlabored /airway intact. Denies fever or chills.

## 2014-07-18 NOTE — ED Provider Notes (Signed)
CSN: [id number]     Arrival date & time 07/18/14  0431 History   First MD Initiated Contact with Patient 07/18/14 [phone]     Chief Complaint  Patient presents with  . Allergic Reaction     (Consider location/radiation/quality/duration/timing/severity/associated sxs/prior Treatment) Patient is a 24 y.o. female presenting with allergic reaction. The history is provided by the patient.  Allergic Reaction Presenting symptoms: itching and rash   Presenting symptoms: no difficulty swallowing   Itching:    Location:  Full body   Severity:  Moderate   Onset quality:  Gradual   Duration:  1 week   Timing:  Constant   Progression:  Unchanged Severity:  Mild Prior allergic episodes:  Unable to specify (no loyal to detergent soap or shampoo so is unable to delineate which might be the culprit.  Did not take the keflex) Context: no grass   Relieved by:  Nothing Worsened by:  Nothing tried Ineffective treatments:  Antihistamines   History reviewed. No pertinent past medical history. History reviewed. No pertinent past surgical history. No family history on file. History  Substance Use Topics  . Smoking status: Never Smoker   . Smokeless tobacco: Not on file  . Alcohol Use: Yes     Comment: social   OB History    Gravida Para Term Preterm AB TAB SAB Ectopic Multiple Living   1 1 0 1      1     Review of Systems  HENT: Negative for drooling, facial swelling and trouble swallowing.   Skin: Positive for itching and rash. Negative for color change and wound.  All other systems reviewed and are negative.     Allergies  Latex  Home Medications   Prior to Admission medications   Medication Sig Start Date End Date Taking? Authorizing Provider  acetaminophen (TYLENOL) 325 MG tablet Take 650 mg by mouth every 6 (six) hours as needed (back pain).    Historical Provider, MD  cephALEXin (KEFLEX) 500 MG capsule Take 1 capsule (500 mg total) by mouth 4 (four) times daily. Take all of  medicine and drink lots of fluids 07/14/14   Linna Hoff, MD  HYDROcodone-acetaminophen (NORCO) 5-325 MG per tablet Take 1-2 tablets by mouth every 6 (six) hours as needed (for pain). 07/03/14   John Molpus, MD  metroNIDAZOLE (FLAGYL) 500 MG tablet Take 1 tablet (500 mg total) by mouth 2 (two) times daily. One po bid x 7 days 07/03/14   Paula Libra, MD   BP 109/66 mmHg  Pulse 63  Temp(Src) 98.3 F (36.8 C) (Oral)  Resp 16  SpO2 100%  LMP 07/18/2014 Physical Exam  Constitutional: She is oriented to person, place, and time. She appears well-developed and well-nourished. No distress.  HENT:  Head: Normocephalic and atraumatic.  Mouth/Throat: Oropharynx is clear and moist. No oropharyngeal exudate.  No swelling of the lips tongue uvula  Eyes: Conjunctivae and EOM are normal. Pupils are equal, round, and reactive to light.  Neck: Normal range of motion. Neck supple.  Cardiovascular: Normal rate, regular rhythm and intact distal pulses.   Pulmonary/Chest: Effort normal and breath sounds normal. No stridor. No respiratory distress. She has no wheezes. She has no rales.  Abdominal: Soft. Bowel sounds are normal. There is no tenderness. There is no rebound and no guarding.  Musculoskeletal: Normal range of motion.  Neurological: She is alert and oriented to person, place, and time.  Skin: Skin is warm and dry. No rash noted. No erythema. No  pallor.  Psychiatric: She has a normal mood and affect.    ED Course  Procedures (including critical care time) Labs Review Labs Reviewed - No data to display  Imaging Review No results found.   EKG Interpretation None      MDM   Final diagnoses:  None    Mild allergic reaction.  Switch to all free and ivory soap.  Will start prednisone and pepcid in addition to benadryl.  Strict return precautions given    Justeen Hehr, MD 07/18/14 0500

## 2014-10-09 ENCOUNTER — Inpatient Hospital Stay (HOSPITAL_COMMUNITY)
Admission: AD | Admit: 2014-10-09 | Discharge: 2014-10-09 | Disposition: A | Discharge status: Home / Self Care | Payer: PRIVATE HEALTH INSURANCE | Source: Ambulatory Visit | Attending: Obstetrics and Gynecology | Admitting: Obstetrics and Gynecology

## 2014-10-09 ENCOUNTER — Encounter (HOSPITAL_COMMUNITY): Payer: Self-pay | Admitting: *Deleted

## 2014-10-09 DIAGNOSIS — N63 Unspecified lump in unspecified breast: Secondary | ICD-10-CM

## 2014-10-09 HISTORY — DX: Anemia, unspecified: D64.9

## 2014-10-09 LAB — POCT PREGNANCY, URINE: Preg Test, Ur: NEGATIVE

## 2014-10-09 NOTE — MAU Note (Signed)
Pt states she has small lump in R breast, she found it on Friday.  Lump is painful especially when lying down.

## 2014-10-09 NOTE — MAU Note (Signed)
C/o small lump on R breast since Friday (4-5 days ago);

## 2014-10-09 NOTE — MAU Provider Note (Signed)
History     CSN: [id number]  Arrival date and time: 10/09/14 1658   First Provider Initiated Contact with Patient 10/09/14 1728       Chief Complaint  Patient presents with  . Breast Mass   HPI  Brittney Mcfarland is a 24 y.o. female patient who presents with breast lump. Noticed it on Friday. Tender to touch. LMP 2 weeks ago. Denies nipple discharge. Denies fever. Is not breastfeeding.   Past Medical History  Diagnosis Date  . Anemia     Past Surgical History  Procedure Laterality Date  . Dilation and curettage of uterus      Family History  Problem Relation Age of Onset  . Hypertension Mother     Social History  Substance Use Topics  . Smoking status: Never Smoker   . Smokeless tobacco: Not on file  . Alcohol Use: Yes     Comment: social    Allergies:  Allergies  Allergen Reactions  . Latex Hives    Prescriptions prior to admission  Medication Sig Dispense Refill Last Dose  . cephALEXin (KEFLEX) 500 MG capsule Take 1 capsule (500 mg total) by mouth 4 (four) times daily. Take all of medicine and drink lots of fluids 20 capsule 0   . diphenhydrAMINE (BENADRYL) 25 mg capsule Take 25 mg by mouth every 6 (six) hours as needed for allergies.   07/18/2014 at Unknown time  . famotidine (PEPCID) 20 MG tablet Take 1 tablet (20 mg total) by mouth 2 (two) times daily. 14 tablet 0   . HYDROcodone-acetaminophen (NORCO) 5-325 MG per tablet Take 1-2 tablets by mouth every 6 (six) hours as needed (for pain). (Patient not taking: Reported on 07/18/2014) 20 tablet 0 Not Taking at Unknown time  . metroNIDAZOLE (FLAGYL) 500 MG tablet Take 1 tablet (500 mg total) by mouth 2 (two) times daily. One po bid x 7 days (Patient not taking: Reported on 07/18/2014) 14 tablet 0 Completed Course at Unknown time  . predniSONE (DELTASONE) 10 MG tablet Take 2 tablets (20 mg total) by mouth daily. 15 tablet 0     Review of Systems  Constitutional: Negative for fever.  Respiratory: Negative.     Cardiovascular: Negative.   Skin: Negative.    Physical Exam   Blood pressure 104/51, pulse 65, temperature 98.1 F (36.7 C), temperature source Oral, resp. rate 18, last menstrual period 10/02/2014.  Physical Exam  Nursing note and vitals reviewed. Constitutional: She is oriented to person, place, and time. She appears well-developed and well-nourished. No distress.  HENT:  Head: Normocephalic and atraumatic.  Eyes: Conjunctivae are normal. Right eye exhibits no discharge. Left eye exhibits no discharge. No scleral icterus.  Neck: Normal range of motion.  Respiratory: Effort normal. No respiratory distress. Right breast exhibits mass. Right breast exhibits no nipple discharge, no skin change and no tenderness. Breasts are symmetrical.    Breast exam normal aside from noted nodule.   Neurological: She is alert and oriented to person, place, and time.  Skin: Skin is warm and dry. She is not diaphoretic.  Psychiatric: She has a normal mood and affect. Her behavior is normal. Judgment and thought content normal.    MAU Course  Procedures Results for orders placed or performed during the hospital encounter of 10/09/14 (from the past 24 hour(s))  Pregnancy, urine POC     Status: None   Collection Time: 10/09/14  5:45 PM  Result Value Ref Range   Preg Test, Ur NEGATIVE NEGATIVE  MDM Spoke with Dr. Tenny Craw. Discharge home; f/u with office as needed.   Assessment and Plan  A: 1. Subcutaneous nodule of breast    P: Discharge home F/u at [facility] PRN symptoms change/worsen.   Judeth Horn, NP  10/09/2014, 5:28 PM

## 2014-10-09 NOTE — Discharge Instructions (Signed)
Breast Cyst A breast cyst is a sac in the breast that is filled with fluid. Breast cysts are common in women. Women can have one or many cysts. When the breasts contain many cysts, it is usually due to a noncancerous (benign) condition called fibrocystic change. These lumps form under the influence of female hormones (estrogen and progesterone). The lumps are most often located in the upper, outer portion of the breast. They are often more swollen, painful, and tender before your period starts. They usually disappear after menopause, unless you are on hormone therapy.  There are several types of cysts:  Macrocyst. This is a cyst that is about 2 in. (5.1 cm) in diameter.   Microcyst. This is a tiny cyst that you cannot feel but can be seen with a mammogram or an ultrasound.   Galactocele. This is a cyst containing milk that may develop if you suddenly stop breastfeeding.   Sebaceous cyst of the skin. This type of cyst is not in the breast tissue itself. Breast cysts do not increase your risk of breast cancer. However, they must be monitored closely because they can be cancerous.  CAUSES  It is not known exactly what causes a breast cyst to form. Possible causes include:  An overgrowth of milk glands and connective tissue in the breast can block the milk glands, causing them to fill with fluid.   Scar tissue in the breast from previous surgery may block the glands, causing a cyst.  RISK FACTORS Estrogen may influence the development of a breast cyst.  SIGNS AND SYMPTOMS   Feeling a smooth, round, soft lump (like a grape) in the breast that is easily moveable.   Breast discomfort or pain.  Increase in size of the lump before your menstrual period and decrease in its size after your menstrual period.  DIAGNOSIS  A cyst can be felt during a physical exam by your health care provider. A breast X-ray exam (mammogram) and ultrasonography will be done to confirm the diagnosis. Fluid may  be removed from the cyst with a needle (fine needle aspiration) to make sure the cyst is not cancerous.  TREATMENT  Treatment may not be necessary. Your health care provider may monitor the cyst to see if it goes away on its own. If treatment is needed, it may include:  Hormone treatment.   Needle aspiration. There is a chance of the cyst coming back after aspiration.   Surgery to remove the whole cyst.  HOME CARE INSTRUCTIONS   Keep all follow-up appointments with your health care provider.  See your health care provider regularly:  Get a yearly exam by your health care provider.  Have a clinical breast exam by a health care provider every 1-3 years if you are 20-40 years of age. After age 40 years, you should have the exam every year.   Get mammogram tests as directed by your health care provider.   Understand the normal appearance and feel of your breasts and perform breast self-exams.   Only take over-the-counter or prescription medicines as directed by your health care provider.   Wear a supportive bra, especially when exercising.   Avoid caffeine.   Reduce your salt intake, especially before your menstrual period. Too much salt can cause fluid retention, breast swelling, and discomfort.  SEEK MEDICAL CARE IF:   You feel, or think you feel, a lump in your breast.   You notice that both breasts look or feel different than usual.   Your   breast is still causing pain after your menstrual period is over.   You need medicine for breast pain and swelling that occurs with your menstrual period.  SEEK IMMEDIATE MEDICAL CARE IF:   You have severe pain, tenderness, redness, or warmth in your breast.   You have nipple discharge or bleeding.   Your breast lump becomes hard and painful.   You find new lumps or bumps that were not there before.   You feel lumps in your armpit (axilla).   You notice dimpling or wrinkling of the breast or nipple.   You  have a fever.  MAKE SURE YOU:  Understand these instructions.  Will watch your condition.  Will get help right away if you are not doing well or get worse. Document Released: 01/12/2005 Document Revised: 09/14/2012 Document Reviewed: 08/11/2012 Exit[employer] Patient Information 2015 [employer]. This information is not intended to replace advice given to you by your health care provider. Make sure you discuss any questions you have with your health care provider.  

## 2014-10-12 ENCOUNTER — Emergency Department (HOSPITAL_COMMUNITY)
Admission: EM | Admit: 2014-10-12 | Discharge: 2014-10-12 | Disposition: A | Discharge status: Home / Self Care | Payer: PRIVATE HEALTH INSURANCE | Attending: Emergency Medicine | Admitting: Emergency Medicine

## 2014-10-12 ENCOUNTER — Encounter (HOSPITAL_COMMUNITY): Payer: Self-pay

## 2014-10-12 DIAGNOSIS — Z3202 Encounter for pregnancy test, result negative: Secondary | ICD-10-CM | POA: Insufficient documentation

## 2014-10-12 DIAGNOSIS — N390 Urinary tract infection, site not specified: Secondary | ICD-10-CM

## 2014-10-12 DIAGNOSIS — Z862 Personal history of diseases of the blood and blood-forming organs and certain disorders involving the immune mechanism: Secondary | ICD-10-CM | POA: Insufficient documentation

## 2014-10-12 DIAGNOSIS — Z91040 Latex allergy status: Secondary | ICD-10-CM | POA: Insufficient documentation

## 2014-10-12 LAB — URINE MICROSCOPIC-ADD ON

## 2014-10-12 LAB — URINALYSIS, ROUTINE W REFLEX MICROSCOPIC
BILIRUBIN URINE: NEGATIVE
Glucose, UA: NEGATIVE mg/dL
KETONES UR: NEGATIVE mg/dL
NITRITE: NEGATIVE
Protein, ur: 30 mg/dL — AB
SPECIFIC GRAVITY, URINE: 1.019 (ref 1.005–1.030)
UROBILINOGEN UA: 0.2 mg/dL (ref 0.0–1.0)
pH: 6 (ref 5.0–8.0)

## 2014-10-12 LAB — POC URINE PREG, ED: PREG TEST UR: NEGATIVE

## 2014-10-12 MED ORDER — PHENAZOPYRIDINE HCL 200 MG PO TABS: 200.0000 mg | ORAL_TABLET | Freq: Three times a day (TID) | ORAL | Status: DC

## 2014-10-12 MED ORDER — CEPHALEXIN 500 MG PO CAPS: 500.0000 mg | ORAL_CAPSULE | Freq: Four times a day (QID) | ORAL | Status: DC

## 2014-10-12 NOTE — ED Notes (Signed)
Bed: WTR5 Expected date:  Expected time:  Means of arrival:  Comments: 

## 2014-10-12 NOTE — Discharge Instructions (Signed)

## 2014-10-12 NOTE — ED Provider Notes (Signed)
CSN: [id number]     Arrival date & time 10/12/14  0422 History   First MD Initiated Contact with Patient 10/12/14 [phone]     Chief Complaint  Patient presents with  . Dysuria     (Consider location/radiation/quality/duration/timing/severity/associated sxs/prior Treatment) Patient is a 24 y.o. female presenting with dysuria. The history is provided by the patient. No language interpreter was used.  Dysuria Pain quality:  Aching Pain severity:  Moderate Timing:  Constant Progression:  Worsening Chronicity:  New Recent urinary tract infections: no   Relieved by:  Nothing Worsened by:  Nothing tried Ineffective treatments:  None tried Urinary symptoms: foul-smelling urine   Urinary symptoms: no discolored urine   Associated symptoms: no nausea   Risk factors: recurrent urinary tract infections     Past Medical History  Diagnosis Date  . Anemia    Past Surgical History  Procedure Laterality Date  . Dilation and curettage of uterus     Family History  Problem Relation Age of Onset  . Hypertension Mother   . Cancer Neg Hx     no family hx of breast cancer   Social History  Substance Use Topics  . Smoking status: Never Smoker   . Smokeless tobacco: None  . Alcohol Use: Yes     Comment: social   OB History    Gravida Para Term Preterm AB TAB SAB Ectopic Multiple Living   1 1 0 1      0     Review of Systems  Gastrointestinal: Negative for nausea.  Genitourinary: Positive for dysuria.  All other systems reviewed and are negative.     Allergies  Latex  Home Medications   Prior to Admission medications   Medication Sig Start Date End Date Taking? Authorizing Provider  phenazopyridine (PYRIDIUM) 95 MG tablet Take 95 mg by mouth 3 (three) times daily as needed for pain.   Yes Historical Provider, MD   BP 111/59 mmHg  Pulse 76  Temp(Src) 97.8 F (36.6 C) (Oral)  Resp 20  SpO2 100%  LMP 10/02/2014 (Exact Date) Physical Exam  Constitutional: She is oriented  to person, place, and time. She appears well-developed and well-nourished.  HENT:  Head: Normocephalic and atraumatic.  Eyes: EOM are normal. Pupils are equal, round, and reactive to light.  Neck: Normal range of motion.  Cardiovascular: Normal rate and normal heart sounds.   Pulmonary/Chest: Effort normal.  Abdominal: Soft. She exhibits no distension.  Musculoskeletal: Normal range of motion.  Neurological: She is alert and oriented to person, place, and time.  Skin: Skin is warm.  Psychiatric: She has a normal mood and affect.  Nursing note and vitals reviewed.   ED Course  Procedures (including critical care time) Labs Review Labs Reviewed  URINALYSIS, ROUTINE W REFLEX MICROSCOPIC (NOT AT [facility]) - Abnormal; Notable for the following:    APPearance CLOUDY (*)    Hgb urine dipstick LARGE (*)    Protein, ur 30 (*)    Leukocytes, UA MODERATE (*)    All other components within normal limits  URINE MICROSCOPIC-ADD ON - Abnormal; Notable for the following:    Squamous Epithelial / LPF FEW (*)    Bacteria, UA FEW (*)    All other components within normal limits  POC URINE PREG, ED    Imaging Review No results found. I have personally reviewed and evaluated these images and lab results as part of my medical decision-making.   EKG Interpretation None      MDM  Final diagnoses:  UTI (lower urinary tract infection)    Keflex Pyridium avs    Elson Areas, PA-C 10/12/14 1610  Tomasita Crumble, MD 10/12/14 [phone]

## 2014-10-12 NOTE — ED Notes (Signed)
Pt complains of blood in her urine a and painful urination for two days

## 2015-09-03 ENCOUNTER — Emergency Department (HOSPITAL_COMMUNITY)
Admission: EM | Admit: 2015-09-03 | Discharge: 2015-09-03 | Disposition: A | Discharge status: Home / Self Care | Payer: PRIVATE HEALTH INSURANCE | Attending: Emergency Medicine | Admitting: Emergency Medicine

## 2015-09-03 ENCOUNTER — Encounter (HOSPITAL_COMMUNITY): Payer: Self-pay | Admitting: Emergency Medicine

## 2015-09-03 ENCOUNTER — Emergency Department (HOSPITAL_COMMUNITY): Payer: PRIVATE HEALTH INSURANCE

## 2015-09-03 DIAGNOSIS — Y999 Unspecified external cause status: Secondary | ICD-10-CM | POA: Insufficient documentation

## 2015-09-03 DIAGNOSIS — H109 Unspecified conjunctivitis: Secondary | ICD-10-CM

## 2015-09-03 DIAGNOSIS — Z79899 Other long term (current) drug therapy: Secondary | ICD-10-CM | POA: Insufficient documentation

## 2015-09-03 DIAGNOSIS — M25532 Pain in left wrist: Secondary | ICD-10-CM | POA: Insufficient documentation

## 2015-09-03 DIAGNOSIS — Y929 Unspecified place or not applicable: Secondary | ICD-10-CM | POA: Insufficient documentation

## 2015-09-03 DIAGNOSIS — M25512 Pain in left shoulder: Secondary | ICD-10-CM | POA: Insufficient documentation

## 2015-09-03 DIAGNOSIS — Y9389 Activity, other specified: Secondary | ICD-10-CM | POA: Insufficient documentation

## 2015-09-03 DIAGNOSIS — Z91040 Latex allergy status: Secondary | ICD-10-CM | POA: Insufficient documentation

## 2015-09-03 DIAGNOSIS — M25531 Pain in right wrist: Secondary | ICD-10-CM | POA: Insufficient documentation

## 2015-09-03 LAB — RAPID URINE DRUG SCREEN, HOSP PERFORMED
Amphetamines: NOT DETECTED
BENZODIAZEPINES: NOT DETECTED
Barbiturates: NOT DETECTED
Cocaine: NOT DETECTED
Opiates: NOT DETECTED
Tetrahydrocannabinol: NOT DETECTED

## 2015-09-03 LAB — POC URINE PREG, ED: PREG TEST UR: NEGATIVE

## 2015-09-03 MED ORDER — ERYTHROMYCIN 5 MG/GM OP OINT: TOPICAL_OINTMENT | OPHTHALMIC | 0 refills | Status: DC

## 2015-09-03 MED ORDER — TETRACAINE HCL 0.5 % OP SOLN
2.0000 [drp] | Freq: Once | OPHTHALMIC | Status: AC
  Administered 2015-09-03: 2 [drp] via OPHTHALMIC
  Filled 2015-09-03: qty 4

## 2015-09-03 MED ORDER — SODIUM CHLORIDE 0.9 % IR SOLN
1000.0000 mL | Status: DC
  Administered 2015-09-03: 1000 mL

## 2015-09-03 MED ORDER — FLUORESCEIN SODIUM 1 MG OP STRP
1.0000 | ORAL_STRIP | Freq: Once | OPHTHALMIC | Status: AC
  Administered 2015-09-03: 1 via OPHTHALMIC
  Filled 2015-09-03: qty 1

## 2015-09-03 NOTE — ED Notes (Signed)
Poison control states advises to repeat irrigation with normal saline and morgan lens for 10-15 minutes, then eye exam with fluorescin as corneal abrasions may occur. Corneal abrasions typically resolve spontaneously, but PC recommends considering sending patient to opthalmology if abrasion is present.
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
## 2015-09-03 NOTE — ED Notes (Signed)
Pt aware of urine sample 

## 2015-09-03 NOTE — ED Notes (Signed)
Morgan lens placed bilaterally with 1L NS per eye, per EDP Isaacs. Irrigation ran approximately 5 minutes before patient requesting Brittney Mcfarland Lens' be removed.
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
## 2015-10-07 ENCOUNTER — Inpatient Hospital Stay (HOSPITAL_COMMUNITY): Payer: Medicaid Other

## 2015-10-07 ENCOUNTER — Encounter (HOSPITAL_COMMUNITY): Payer: Self-pay | Admitting: *Deleted

## 2015-10-07 ENCOUNTER — Inpatient Hospital Stay (HOSPITAL_COMMUNITY)
Admission: AD | Admit: 2015-10-07 | Discharge: 2015-10-07 | Disposition: A | Discharge status: Home / Self Care | Payer: Medicaid Other | Source: Ambulatory Visit | Attending: Family Medicine | Admitting: Family Medicine

## 2015-10-07 DIAGNOSIS — Z3A01 Less than 8 weeks gestation of pregnancy: Secondary | ICD-10-CM | POA: Insufficient documentation

## 2015-10-07 DIAGNOSIS — O23591 Infection of other part of genital tract in pregnancy, first trimester: Secondary | ICD-10-CM | POA: Diagnosis not present

## 2015-10-07 DIAGNOSIS — O26891 Other specified pregnancy related conditions, first trimester: Secondary | ICD-10-CM | POA: Insufficient documentation

## 2015-10-07 DIAGNOSIS — B9689 Other specified bacterial agents as the cause of diseases classified elsewhere: Secondary | ICD-10-CM | POA: Diagnosis not present

## 2015-10-07 DIAGNOSIS — O26899 Other specified pregnancy related conditions, unspecified trimester: Secondary | ICD-10-CM

## 2015-10-07 DIAGNOSIS — N760 Acute vaginitis: Secondary | ICD-10-CM | POA: Diagnosis not present

## 2015-10-07 DIAGNOSIS — R109 Unspecified abdominal pain: Secondary | ICD-10-CM | POA: Insufficient documentation

## 2015-10-07 DIAGNOSIS — O3680X0 Pregnancy with inconclusive fetal viability, not applicable or unspecified: Secondary | ICD-10-CM

## 2015-10-07 LAB — WET PREP, GENITAL
Sperm: NONE SEEN
Trich, Wet Prep: NONE SEEN
Yeast Wet Prep HPF POC: NONE SEEN

## 2015-10-07 LAB — URINALYSIS, ROUTINE W REFLEX MICROSCOPIC
BILIRUBIN URINE: NEGATIVE
GLUCOSE, UA: NEGATIVE mg/dL
HGB URINE DIPSTICK: NEGATIVE
Ketones, ur: 15 mg/dL — AB
Leukocytes, UA: NEGATIVE
Nitrite: NEGATIVE
PROTEIN: NEGATIVE mg/dL
Specific Gravity, Urine: 1.025 (ref 1.005–1.030)
pH: 6 (ref 5.0–8.0)

## 2015-10-07 LAB — CBC
HCT: 37.5 % (ref 36.0–46.0)
HEMOGLOBIN: 12.9 g/dL (ref 12.0–15.0)
MCH: 28.4 pg (ref 26.0–34.0)
MCHC: 34.4 g/dL (ref 30.0–36.0)
MCV: 82.4 fL (ref 78.0–100.0)
Platelets: 247 10*3/uL (ref 150–400)
RBC: 4.55 MIL/uL (ref 3.87–5.11)
RDW: 13.6 % (ref 11.5–15.5)
WBC: 5.7 10*3/uL (ref 4.0–10.5)

## 2015-10-07 LAB — POCT PREGNANCY, URINE: PREG TEST UR: POSITIVE — AB

## 2015-10-07 LAB — HCG, QUANTITATIVE, PREGNANCY: hCG, Beta Chain, Quant, S: 1324 m[IU]/mL — ABNORMAL HIGH (ref <5)

## 2015-10-07 MED ORDER — METRONIDAZOLE 500 MG PO TABS: 500.0000 mg | ORAL_TABLET | Freq: Two times a day (BID) | ORAL | 0 refills | Status: AC

## 2015-10-07 NOTE — MAU Note (Signed)
Pt had pos HPT this morning.  Denies pain, has some lower abd cramping.

## 2015-10-07 NOTE — MAU Provider Note (Signed)
History     CSN: [id number]  Arrival date and time: 10/07/15 1657   First Provider Initiated Contact with Patient 10/07/15 1943       Chief Complaint  Patient presents with  . Abdominal Pain   HPI Brittney Mcfarland is a 25 y.o. G2P0100 at [redacted]w[redacted]d by LMP who presents with abdominal cramping. Symptoms began 3 days ago. Reports intermittent lower abdominal cramping. Rates pain 4/10. Has not treated. No aggravating or alleviating factors. Denies n/v/d, constipation, vaginal discharge, or vaginal bleeding.   OB History    Gravida Para Term Preterm AB Living   2 1 0 1   0   SAB TAB Ectopic Multiple Live Births           1      Past Medical History:  Diagnosis Date  . Anemia   . Preterm labor     Past Surgical History:  Procedure Laterality Date  . DILATION AND CURETTAGE OF UTERUS      Family History  Problem Relation Age of Onset  . Hypertension Mother   . Cancer Neg Hx     no family hx of breast cancer    Social History  Substance Use Topics  . Smoking status: Never Smoker  . Smokeless tobacco: Never Used  . Alcohol use Yes     Comment: social    Allergies:  Allergies  Allergen Reactions  . Latex Hives    Prescriptions Prior to Admission  Medication Sig Dispense Refill Last Dose  . acetaminophen (TYLENOL) 500 MG tablet Take 500 mg by mouth every 6 (six) hours as needed for moderate pain.   Past Month at Unknown time  . cephALEXin (KEFLEX) 500 MG capsule Take 1 capsule (500 mg total) by mouth 4 (four) times daily. (Patient not taking: Reported on 09/03/2015) 40 capsule 0 Completed Course at Unknown time  . erythromycin ophthalmic ointment Place a 1/2 inch ribbon of ointment into the lower eyelid three times daily for 5 days. 3.5 g 0   . phenazopyridine (PYRIDIUM) 200 MG tablet Take 1 tablet (200 mg total) by mouth 3 (three) times daily. (Patient not taking: Reported on 09/03/2015) 6 tablet 0 Completed Course at Unknown time    Review of Systems  Constitutional:  Negative.   Gastrointestinal: Positive for abdominal pain. Negative for constipation, diarrhea, nausea and vomiting.  Genitourinary: Negative.    Physical Exam   Blood pressure 112/58, pulse 89, temperature 98.2 F (36.8 C), temperature source Oral, resp. rate 16, height 5\' 3"  (1.6 m), weight 188 lb (85.3 kg), last menstrual period 09/06/2015.  Physical Exam  Nursing note and vitals reviewed. Constitutional: She is oriented to person, place, and time. She appears well-developed and well-nourished. No distress.  HENT:  Head: Normocephalic and atraumatic.  Eyes: Conjunctivae are normal. Right eye exhibits no discharge. Left eye exhibits no discharge. No scleral icterus.  Neck: Normal range of motion.  Cardiovascular: Normal rate, regular rhythm and normal heart sounds.   No murmur heard. Respiratory: Effort normal and breath sounds normal. No respiratory distress. She has no wheezes.  GI: Soft. Bowel sounds are normal. She exhibits no distension. There is no tenderness. There is no rebound and no guarding.  Neurological: She is alert and oriented to person, place, and time.  Skin: Skin is warm and dry. She is not diaphoretic.  Psychiatric: She has a normal mood and affect. Her behavior is normal. Judgment and thought content normal.    MAU Course  Procedures Results for orders  placed or performed during the hospital encounter of 10/07/15 (from the past 24 hour(s))  Urinalysis, Routine w reflex microscopic (not at [facility])     Status: Abnormal   Collection Time: 10/07/15  6:00 PM  Result Value Ref Range   Color, Urine YELLOW YELLOW   APPearance CLEAR CLEAR   Specific Gravity, Urine 1.025 1.005 - 1.030   pH 6.0 5.0 - 8.0   Glucose, UA NEGATIVE NEGATIVE mg/dL   Hgb urine dipstick NEGATIVE NEGATIVE   Bilirubin Urine NEGATIVE NEGATIVE   Ketones, ur 15 (A) NEGATIVE mg/dL   Protein, ur NEGATIVE NEGATIVE mg/dL   Nitrite NEGATIVE NEGATIVE   Leukocytes, UA NEGATIVE NEGATIVE  Pregnancy,  urine POC     Status: Abnormal   Collection Time: 10/07/15  6:19 PM  Result Value Ref Range   Preg Test, Ur POSITIVE (A) NEGATIVE  hCG, quantitative, pregnancy     Status: Abnormal   Collection Time: 10/07/15  7:20 PM  Result Value Ref Range   hCG, Beta Chain, Quant, S 1,324 (H) <5 mIU/mL  ABO/Rh     Status: None (Preliminary result)   Collection Time: 10/07/15  7:23 PM  Result Value Ref Range   ABO/RH(D) O POS   CBC     Status: None   Collection Time: 10/07/15  7:24 PM  Result Value Ref Range   WBC 5.7 4.0 - 10.5 K/uL   RBC 4.55 3.87 - 5.11 MIL/uL   Hemoglobin 12.9 12.0 - 15.0 g/dL   HCT 16.1 09.6 - 04.5 %   MCV 82.4 78.0 - 100.0 fL   MCH 28.4 26.0 - 34.0 pg   MCHC 34.4 30.0 - 36.0 g/dL   RDW 40.9 81.1 - 91.4 %   Platelets 247 150 - 400 K/uL  Wet prep, genital     Status: Abnormal   Collection Time: 10/07/15  7:25 PM  Result Value Ref Range   Yeast Wet Prep HPF POC NONE SEEN NONE SEEN   Trich, Wet Prep NONE SEEN NONE SEEN   Clue Cells Wet Prep HPF POC PRESENT (A) NONE SEEN   WBC, Wet Prep HPF POC FEW (A) NONE SEEN   Sperm NONE SEEN    US Ob Comp Less 14 Wks  Result Date: 10/07/2015 CLINICAL DATA:  Lower abdominal cramping. EXAM: OBSTETRIC <14 WK [place] AND TRANSVAGINAL OB US TECHNIQUE: Both transabdominal and transvaginal ultrasound examinations were performed for complete evaluation of the gestation as well as the maternal uterus, adnexal regions, and pelvic cul-de-sac. Transvaginal technique was performed to assess early pregnancy. COMPARISON:  Pelvic ultrasound 07/03/2014 FINDINGS: Intrauterine gestational sac: Small cystic focus suggestive of a single small intrauterine gestational sac Yolk sac:  Not visualized Embryo:  Not visualized MSD: 4.0  mm   5 w   0  d Subchorionic hemorrhage:  None visualized. Maternal uterus/adnexae: Normal appearance of the ovaries. Trace to small volume pelvic free fluid. IMPRESSION: Probable early intrauterine gestational sac, but no yolk sac, fetal  pole, or cardiac activity yet visualized. Recommend follow-up quantitative B-HCG levels and follow-up US in 14 days to confirm and assess viability. This recommendation follows SRU consensus guidelines: Diagnostic Criteria for Nonviable Pregnancy Early in the First Trimester. Malva Limes Med 2013; 782:9562-13. Electronically Signed   By: Sebastian Ache M.D.   On: 10/07/2015 20:42   US Ob Transvaginal  Result Date: 10/07/2015 CLINICAL DATA:  Lower abdominal cramping. EXAM: OBSTETRIC <14 WK [place] AND TRANSVAGINAL OB US TECHNIQUE: Both transabdominal and transvaginal ultrasound examinations were performed  for complete evaluation of the gestation as well as the maternal uterus, adnexal regions, and pelvic cul-de-sac. Transvaginal technique was performed to assess early pregnancy. COMPARISON:  Pelvic ultrasound 07/03/2014 FINDINGS: Intrauterine gestational sac: Small cystic focus suggestive of a single small intrauterine gestational sac Yolk sac:  Not visualized Embryo:  Not visualized MSD: 4.0  mm   5 w   0  d Subchorionic hemorrhage:  None visualized. Maternal uterus/adnexae: Normal appearance of the ovaries. Trace to small volume pelvic free fluid. IMPRESSION: Probable early intrauterine gestational sac, but no yolk sac, fetal pole, or cardiac activity yet visualized. Recommend follow-up quantitative B-HCG levels and follow-up US in 14 days to confirm and assess viability. This recommendation follows SRU consensus guidelines: Diagnostic Criteria for Nonviable Pregnancy Early in the First Trimester. Malva Limes Engl J Med 2013; 960:4540-98; 369:1443-51. Electronically Signed   By: Sebastian AcheAllen  Grady M.D.   On: 10/07/2015 20:42     MDM +UPT UA, wet prep, GC/chlamydia, CBC, ABO/Rh, quant hCG, HIV, and US today to rule out ectopic pregnancy Ultrasound shows possible IUGS, no yolk sac; will have pt f/u for bhcg  Assessment and Plan  A: 1. Pregnancy of unknown anatomic location   2. Abdominal pain affecting pregnancy   3. BV (bacterial  vaginosis)     P: Discharge home [facility] Thursday morning Discussed reasons to return to MAU  Judeth HornErin Ein Rijo 10/07/2015, 7:43 PM

## 2015-10-07 NOTE — Discharge Instructions (Signed)

## 2015-10-08 LAB — GC/CHLAMYDIA PROBE AMP (~~LOC~~) NOT AT ARMC
CHLAMYDIA, DNA PROBE: NEGATIVE
Neisseria Gonorrhea: NEGATIVE

## 2015-10-08 LAB — ABO/RH: ABO/RH(D): O POS

## 2015-10-08 LAB — HIV ANTIBODY (ROUTINE TESTING W REFLEX): HIV SCREEN 4TH GENERATION: NONREACTIVE

## 2015-10-10 ENCOUNTER — Encounter: Payer: Self-pay | Admitting: General Practice

## 2015-10-10 ENCOUNTER — Other Ambulatory Visit: Payer: PRIVATE HEALTH INSURANCE

## 2015-10-10 ENCOUNTER — Telehealth: Payer: Self-pay | Admitting: General Practice

## 2015-10-10 NOTE — Telephone Encounter (Signed)
Patient no showed for stat bhcg appt. Called patient, no answer- left message stating we are trying to reach you regarding your missed lab appt. Please call our front office so we can reschedule that for tomorrow. Will send certified letter

## 2015-12-02 ENCOUNTER — Inpatient Hospital Stay (HOSPITAL_COMMUNITY)
Admission: AD | Admit: 2015-12-02 | Discharge: 2015-12-03 | Disposition: A | Discharge status: Home / Self Care | Payer: Medicaid Other | Source: Ambulatory Visit | Attending: Family Medicine | Admitting: Family Medicine

## 2015-12-02 ENCOUNTER — Encounter (HOSPITAL_COMMUNITY): Payer: Self-pay

## 2015-12-02 DIAGNOSIS — O210 Mild hyperemesis gravidarum: Secondary | ICD-10-CM | POA: Insufficient documentation

## 2015-12-02 DIAGNOSIS — O219 Vomiting of pregnancy, unspecified: Secondary | ICD-10-CM | POA: Diagnosis not present

## 2015-12-02 DIAGNOSIS — O26891 Other specified pregnancy related conditions, first trimester: Secondary | ICD-10-CM | POA: Insufficient documentation

## 2015-12-02 DIAGNOSIS — R519 Headache, unspecified: Secondary | ICD-10-CM

## 2015-12-02 DIAGNOSIS — R51 Headache: Secondary | ICD-10-CM | POA: Diagnosis present

## 2015-12-02 DIAGNOSIS — O26892 Other specified pregnancy related conditions, second trimester: Secondary | ICD-10-CM

## 2015-12-02 DIAGNOSIS — O9989 Other specified diseases and conditions complicating pregnancy, childbirth and the puerperium: Secondary | ICD-10-CM

## 2015-12-02 DIAGNOSIS — M549 Dorsalgia, unspecified: Secondary | ICD-10-CM | POA: Diagnosis not present

## 2015-12-02 DIAGNOSIS — Z3A12 12 weeks gestation of pregnancy: Secondary | ICD-10-CM | POA: Insufficient documentation

## 2015-12-02 LAB — URINALYSIS, ROUTINE W REFLEX MICROSCOPIC
BILIRUBIN URINE: NEGATIVE
GLUCOSE, UA: NEGATIVE mg/dL
HGB URINE DIPSTICK: NEGATIVE
KETONES UR: NEGATIVE mg/dL
Leukocytes, UA: NEGATIVE
Nitrite: NEGATIVE
PH: 6.5 (ref 5.0–8.0)
Protein, ur: NEGATIVE mg/dL
Specific Gravity, Urine: 1.02 (ref 1.005–1.030)

## 2015-12-02 MED ORDER — BUTALBITAL-APAP-CAFFEINE 50-325-40 MG PO TABS
2.0000 | ORAL_TABLET | Freq: Once | ORAL | Status: AC
  Administered 2015-12-02: 2 via ORAL
  Filled 2015-12-02: qty 2

## 2015-12-02 MED ORDER — PROMETHAZINE HCL 25 MG PO TABS
12.5000 mg | ORAL_TABLET | Freq: Once | ORAL | Status: AC
  Administered 2015-12-02: 12.5 mg via ORAL
  Filled 2015-12-02: qty 1

## 2015-12-02 NOTE — MAU Note (Signed)
Pt reports she has had a headache, stomach pain, and back pain for the last 3 days. Denies other complaints.

## 2015-12-03 DIAGNOSIS — M549 Dorsalgia, unspecified: Secondary | ICD-10-CM

## 2015-12-03 DIAGNOSIS — O219 Vomiting of pregnancy, unspecified: Secondary | ICD-10-CM

## 2015-12-03 DIAGNOSIS — O26892 Other specified pregnancy related conditions, second trimester: Secondary | ICD-10-CM | POA: Diagnosis not present

## 2015-12-03 DIAGNOSIS — Z3A12 12 weeks gestation of pregnancy: Secondary | ICD-10-CM

## 2015-12-03 DIAGNOSIS — R51 Headache: Secondary | ICD-10-CM

## 2015-12-03 MED ORDER — PROMETHAZINE HCL 25 MG PO TABS: 12.5000 mg | ORAL_TABLET | Freq: Four times a day (QID) | ORAL | 0 refills | Status: AC | PRN

## 2015-12-03 MED ORDER — BUTALBITAL-APAP-CAFFEINE 50-325-40 MG PO TABS: 1.0000 | ORAL_TABLET | Freq: Four times a day (QID) | ORAL | 0 refills | Status: AC | PRN

## 2015-12-03 NOTE — Discharge Instructions (Signed)

## 2015-12-03 NOTE — MAU Provider Note (Signed)
History     CSN: [id number]  Arrival date and time: 12/02/15 2039   First Provider Initiated Contact with Patient 12/02/15 2255      Chief Complaint  Patient presents with  . Headache   Headache   This is a new problem. The current episode started in the past 7 days. The problem occurs constantly. The problem has been unchanged. The pain is located in the left unilateral region. The pain radiates to the lower back and upper back. The pain quality is not similar to prior headaches. The quality of the pain is described as dull. The pain is at a severity of 10/10. Associated symptoms include nausea. Pertinent negatives include no abdominal pain, fever or vomiting. Nothing aggravates the symptoms. She has tried acetaminophen for the symptoms. The treatment provided no relief.    Past Medical History:  Diagnosis Date  . Anemia   . Preterm labor     Past Surgical History:  Procedure Laterality Date  . DILATION AND CURETTAGE OF UTERUS      Family History  Problem Relation Age of Onset  . Hypertension Mother   . Cancer Neg Hx     no family hx of breast cancer    Social History  Substance Use Topics  . Smoking status: Never Smoker  . Smokeless tobacco: Never Used  . Alcohol use Yes     Comment: social    Allergies:  Allergies  Allergen Reactions  . Latex Hives    Prescriptions Prior to Admission  Medication Sig Dispense Refill Last Dose  . hydrocortisone cream 1 % Apply 1 application topically daily as needed (For rash on face.).   Past Month at Unknown time  . Linoleic Acid-Sunflower Oil (CLA) [phone] MG CAPS Take 1 capsule by mouth 3 (three) times daily.   Past Week at Unknown time  . metroNIDAZOLE (FLAGYL) 500 MG tablet Take 1 tablet (500 mg total) by mouth 2 (two) times daily. 14 tablet 0     Review of Systems  Constitutional: Negative for chills and fever.  Gastrointestinal: Positive for abdominal pain and nausea. Negative for constipation, diarrhea and  vomiting.  Musculoskeletal: Positive for back pain.   Physical Exam   Blood pressure 112/64, pulse 89, temperature 98.3 F (36.8 C), temperature source Oral, resp. rate 18, height 5\' 3"  (1.6 m), weight 186 lb 2 oz (84.4 kg), last menstrual period 09/06/2015, SpO2 100 %.  Physical Exam  Nursing note and vitals reviewed. Constitutional: She is oriented to person, place, and time. She appears well-developed and well-nourished. No distress.  HENT:  Head: Normocephalic.  Cardiovascular: Normal rate.   Respiratory: Effort normal.  GI: There is no tenderness. There is no rebound.  Neurological: She is alert and oriented to person, place, and time.  Skin: Skin is warm and dry.  Psychiatric: She has a normal mood and affect.   Results for orders placed or performed during the hospital encounter of 12/02/15 (from the past 24 hour(s))  Urinalysis, Routine w reflex microscopic (not at [facility])     Status: None   Collection Time: 12/02/15  8:43 PM  Result Value Ref Range   Color, Urine YELLOW YELLOW   APPearance CLEAR CLEAR   Specific Gravity, Urine 1.020 1.005 - 1.030   pH 6.5 5.0 - 8.0   Glucose, UA NEGATIVE NEGATIVE mg/dL   Hgb urine dipstick NEGATIVE NEGATIVE   Bilirubin Urine NEGATIVE NEGATIVE   Ketones, ur NEGATIVE NEGATIVE mg/dL   Protein, ur NEGATIVE NEGATIVE mg/dL  Nitrite NEGATIVE NEGATIVE   Leukocytes, UA NEGATIVE NEGATIVE     MAU Course  Procedures  MDM Patient has had fioricet. She reports that her pain has improved. Back pain has not improved much.   Assessment and Plan   1. Nausea and vomiting in pregnancy   2. [redacted] weeks gestation of pregnancy   3. Back pain affecting pregnancy in second trimester   4. Headache in pregnancy, antepartum, second trimester    DC home Comfort measures reviewed  2nd Trimester precautions  RX: phenergan PRN, fioricet PRN  Return to MAU as needed FU with OB as planned  Follow-up Information    [facility] up.   Specialty:  Obstetrics and Gynecology Contact information: [address] [place] [phone]           Tawnya CrookHogan, Miliana Gangwer Donovan 12/03/2015, 12:49 AM
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
## 2016-08-11 ENCOUNTER — Encounter (HOSPITAL_COMMUNITY): Payer: Self-pay

## 2018-07-16 ENCOUNTER — Other Ambulatory Visit: Payer: Self-pay

## 2018-07-16 ENCOUNTER — Emergency Department (HOSPITAL_COMMUNITY)
Admission: EM | Admit: 2018-07-16 | Discharge: 2018-07-16 | Disposition: A | Discharge status: Home / Self Care | Payer: 59 | Attending: Emergency Medicine | Admitting: Emergency Medicine

## 2018-07-16 ENCOUNTER — Encounter (HOSPITAL_COMMUNITY): Payer: Self-pay | Admitting: Emergency Medicine

## 2018-07-16 DIAGNOSIS — S29012A Strain of muscle and tendon of back wall of thorax, initial encounter: Secondary | ICD-10-CM | POA: Diagnosis not present

## 2018-07-16 DIAGNOSIS — S299XXA Unspecified injury of thorax, initial encounter: Secondary | ICD-10-CM | POA: Diagnosis present

## 2018-07-16 DIAGNOSIS — Y92410 Unspecified street and highway as the place of occurrence of the external cause: Secondary | ICD-10-CM | POA: Diagnosis not present

## 2018-07-16 DIAGNOSIS — Y9389 Activity, other specified: Secondary | ICD-10-CM | POA: Insufficient documentation

## 2018-07-16 DIAGNOSIS — Z91040 Latex allergy status: Secondary | ICD-10-CM | POA: Diagnosis not present

## 2018-07-16 DIAGNOSIS — Y998 Other external cause status: Secondary | ICD-10-CM | POA: Insufficient documentation
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
## 2018-07-16 NOTE — Discharge Instructions (Addendum)
Take ibuprofen 3 times a day with meals.  Do not take other anti-inflammatories at the same time (Advil, Motrin, naproxen, Aleve). You may supplement with Tylenol if you need further pain control. Use muscle cream such as salonpas, icy hot, BenGay, or Biofreeze to help with pain control. Use ice packs or heating pads if this helps control your pain. You will likely have continued muscle stiffness and soreness over the next couple days.  Follow-up with urgent care in 1 week if your symptoms are not improving. Return to the emergency room if you develop vision changes, vomiting, slurred speech, numbness, loss of bowel or bladder control, or any new or worsening symptoms.
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
## 2020-08-05 DIAGNOSIS — M542 Cervicalgia: Secondary | ICD-10-CM | POA: Diagnosis not present

## 2020-08-27 DIAGNOSIS — M542 Cervicalgia: Secondary | ICD-10-CM | POA: Diagnosis not present

## 2020-09-02 DIAGNOSIS — M542 Cervicalgia: Secondary | ICD-10-CM | POA: Diagnosis not present

## 2020-10-02 DIAGNOSIS — N62 Hypertrophy of breast: Secondary | ICD-10-CM | POA: Diagnosis not present

## 2021-07-18 DIAGNOSIS — Z30432 Encounter for removal of intrauterine contraceptive device: Secondary | ICD-10-CM | POA: Diagnosis not present

## 2021-07-18 DIAGNOSIS — R32 Unspecified urinary incontinence: Secondary | ICD-10-CM | POA: Diagnosis not present

## 2021-07-18 DIAGNOSIS — N898 Other specified noninflammatory disorders of vagina: Secondary | ICD-10-CM | POA: Diagnosis not present

## 2021-07-18 DIAGNOSIS — Z6838 Body mass index (BMI) 38.0-38.9, adult: Secondary | ICD-10-CM | POA: Diagnosis not present

## 2021-11-19 DIAGNOSIS — F4323 Adjustment disorder with mixed anxiety and depressed mood: Secondary | ICD-10-CM | POA: Diagnosis not present

## 2022-02-10 ENCOUNTER — Ambulatory Visit: Payer: Self-pay

## 2022-02-10 NOTE — Telephone Encounter (Signed)
    Chief Complaint: Protrusion from vagina, easily pushed back inside Symptoms: Pain,discharge,itching Frequency: Friday Pertinent Negatives: Patient denies fever Disposition: [] ED /[x] Urgent Care (no appt availability in office) / [] Appointment(In office/virtual)/ []  [facility]/ [] Home Care/ [] Refused Recommended Disposition /[] [facility]/ []  Follow-up with PCP Additional Notes:   Reason for Disposition  Patient sounds very sick or weak to the triager  Answer Assessment - Initial Assessment Questions 1. SYMPTOM: "What's the main symptom you're concerned about?" (e.g., pain, itching, dryness)      from vagina 2. LOCATION: "Where is the   located?" (e.g., inside/outside, left/right)     Out side 3. ONSET: "When did the    start?"     Friday 4. PAIN: "Is there any pain?" If Yes, ask: "How bad is it?" (Scale: 1-10; mild, moderate, severe)   -  MILD (1-3): Doesn't interfere with normal activities.    -  MODERATE (4-7): Interferes with normal activities (e.g., work or school) or awakens from sleep.     -  SEVERE (8-10): Excruciating pain, unable to do any normal activities.     6 5. ITCHING: "Is there any itching?" If Yes, ask: "How bad is it?" (Scale: 1-10; mild, moderate, severe)     Mild 6. CAUSE: "What do you think is causing the discharge?" "Have you had the same problem before? What happened then?"     Unsure 7. OTHER SYMPTOMS: "Do you have any other symptoms?" (e.g., fever, itching, vaginal bleeding, pain with urination, injury to genital area, vaginal foreign body)     Discharge 8. PREGNANCY: "Is there any chance you are pregnant?" "When was your last menstrual period?"     No  Protocols used: Vaginal Symptoms-A-AH

## 2022-04-11 ENCOUNTER — Encounter (HOSPITAL_BASED_OUTPATIENT_CLINIC_OR_DEPARTMENT_OTHER): Payer: Self-pay | Admitting: Urology

## 2022-04-11 ENCOUNTER — Emergency Department (HOSPITAL_BASED_OUTPATIENT_CLINIC_OR_DEPARTMENT_OTHER)
Admission: EM | Admit: 2022-04-11 | Discharge: 2022-04-11 | Disposition: A | Discharge status: Home / Self Care | Payer: BC Managed Care – PPO | Attending: Emergency Medicine | Admitting: Emergency Medicine

## 2022-04-11 ENCOUNTER — Other Ambulatory Visit: Payer: Self-pay

## 2022-04-11 DIAGNOSIS — J020 Streptococcal pharyngitis: Secondary | ICD-10-CM

## 2022-04-11 DIAGNOSIS — Z91040 Latex allergy status: Secondary | ICD-10-CM | POA: Insufficient documentation

## 2022-04-11 DIAGNOSIS — Z1152 Encounter for screening for COVID-19: Secondary | ICD-10-CM | POA: Insufficient documentation

## 2022-04-11 DIAGNOSIS — R509 Fever, unspecified: Secondary | ICD-10-CM | POA: Diagnosis not present

## 2022-04-11 LAB — RESP PANEL BY RT-PCR (RSV, FLU A&B, COVID)  RVPGX2
Influenza A by PCR: NEGATIVE
Influenza B by PCR: NEGATIVE
Resp Syncytial Virus by PCR: NEGATIVE
SARS Coronavirus 2 by RT PCR: NEGATIVE

## 2022-04-11 LAB — GROUP A STREP BY PCR: Group A Strep by PCR: DETECTED — AB

## 2022-04-11 MED ORDER — AMOXICILLIN 500 MG PO CAPS: 500.0000 mg | ORAL_CAPSULE | Freq: Two times a day (BID) | ORAL | 0 refills | Status: AC

## 2022-04-11 NOTE — ED Triage Notes (Signed)
Pt states sore throat, fever, body aches that started yesterday Pt states fever at home of 101

## 2022-04-11 NOTE — ED Provider Notes (Signed)
[place] [facility] AT [facility] Provider Note   CSN: [id number] Arrival date & time: 04/11/22  1911     History  Chief Complaint  Patient presents with   Fever    Brittney Mcfarland is a 32 y.o. female with no significant past medical history presents the emergency department complaining of sore throat, fever, body ache starting yesterday.  Reports fever at home with max temp of 101F.  Daughter here with similar symptoms.   Fever Associated symptoms: myalgias and sore throat        Home Medications Prior to Admission medications   Medication Sig Start Date End Date Taking? Authorizing Provider  amoxicillin (AMOXIL) 500 MG capsule Take 1 capsule (500 mg total) by mouth 2 (two) times daily for 7 days. 04/11/22 04/18/22 Yes Keneshia Tena T, PA-C  hydrocortisone cream 1 % Apply 1 application topically daily as needed (For rash on face.).    [provider]  Linoleic Acid-Sunflower Oil (CLA) [phone] MG CAPS Take 1 capsule by mouth 3 (three) times daily.    [provider]  metroNIDAZOLE (FLAGYL) 500 MG tablet Take 1 tablet (500 mg total) by mouth 2 (two) times daily. 10/07/15   Jorje Guild, NP  promethazine (PHENERGAN) 25 MG tablet Take 0.5-1 tablets (12.5-25 mg total) by mouth every 6 (six) hours as needed. 12/03/15   Tresea Mall, CNM  fluticasone (FLONASE) 50 MCG/ACT nasal spray Place 2 sprays into both nostrils 2 (two) times daily. Decrease to 2 sprays/nostril daily after 5 days Patient not taking: Reported on 07/03/2014 05/09/13 07/03/14  Liam Graham, PA-C  norgestimate-ethinyl estradiol (ORTHO-CYCLEN,SPRINTEC,PREVIFEM) 0.25-35 MG-MCG tablet Take 1 tablet by mouth daily. Patient not taking: Reported on 07/03/2014 01/14/13 07/03/14  Allen Norris, MD      Allergies    Latex    Review of Systems   Review of Systems  Constitutional:  Positive for fever.  HENT:  Positive for sore throat.   Musculoskeletal:  Positive for myalgias.   All other systems reviewed and are negative.   Physical Exam Updated Vital Signs BP 110/73 (BP Location: Left Arm)   Pulse (!) 112   Temp 99.4 F (37.4 C) (Oral)   Resp 18   Ht 5\' 3"  (1.6 m)   Wt 90.7 kg   SpO2 100%   BMI 35.42 kg/m  Physical Exam Vitals and nursing note reviewed.  Constitutional:      Appearance: Normal appearance.  HENT:     Head: Normocephalic and atraumatic.     Mouth/Throat:     Lips: Pink.     Mouth: Mucous membranes are moist.     Pharynx: Oropharynx is clear. Posterior oropharyngeal erythema present.     Tonsils: No tonsillar exudate or tonsillar abscesses. 2+ on the right. 2+ on the left.  Eyes:     Conjunctiva/sclera: Conjunctivae normal.  Pulmonary:     Effort: Pulmonary effort is normal. No respiratory distress.  Skin:    General: Skin is warm and dry.  Neurological:     Mental Status: She is alert.  Psychiatric:        Mood and Affect: Mood normal.        Behavior: Behavior normal.     ED Results / Procedures / Treatments   Labs (all labs ordered are listed, but only abnormal results are displayed) Labs Reviewed  GROUP A STREP BY PCR - Abnormal; Notable for the following components:      Result Value   Group  A Strep by PCR DETECTED (*)    All other components within normal limits  RESP PANEL BY RT-PCR (RSV, FLU A&B, COVID)  [id number]    EKG None  Radiology No results found.  Procedures Procedures    Medications Ordered in ED Medications - No data to display  ED Course/ Medical Decision Making/ A&P                             Medical Decision Making Risk Prescription drug management.   This patient is a 32 y.o. female who presents to the ED for concern of sore throat, fever, body aches.   Differential diagnoses prior to evaluation: The emergent differential diagnosis includes, but is not limited to,  upper respiratory infection, lower respiratory infection, allergies, asthma, irritants, sinus/esophageal foreign  body, medications, reflux, interstitial lung disease, postnasal drip, viral illness, sepsis. This is not an exhaustive differential.   Past Medical History / Co-morbidities / Additional history: Chart reviewed. Pertinent results include: No significant PMH  Physical Exam: Physical exam performed. The pertinent findings include:  Tachycardic to 112, otherwise normal vital signs.  Bilateral tonsillar swelling without exudate or abscess  Lab Tests/Imaging studies: I personally interpreted labs/imaging and the pertinent results include: Group A strep positive.  Respiratory panel negative for COVID, flu, RSV.  Disposition: After consideration of the diagnostic results and the patients response to treatment, I feel that emergency department workup does not suggest an emergent condition requiring admission or immediate intervention beyond what has been performed at this time. Patient with symptoms consistent with drip pharyngitis.  Vitals are stable, low-grade fever.  No signs of dehydration, tolerating PO's.    The plan is: Patient will be discharged with instructions to orally hydrate, rest, and use over-the-counter medications such as anti-inflammatories such as ibuprofen and Tylenol for fever.  Will prescribe antibiotics.  The patient is safe for discharge and has been instructed to return immediately for worsening symptoms, change in symptoms or any other concerns.  Final Clinical Impression(s) / ED Diagnoses Final diagnoses:  Strep pharyngitis    Rx / DC Orders ED Discharge Orders          Ordered    amoxicillin (AMOXIL) 500 MG capsule  2 times daily        04/11/22 2102           Portions of this report may have been transcribed using voice recognition software. Every effort was made to ensure accuracy; however, inadvertent computerized transcription errors may be present.    Estill Cotta 04/11/22 2104    Tegeler, Gwenyth Allegra, MD 04/11/22 2236

## 2022-04-11 NOTE — Discharge Instructions (Addendum)
You were in the ER for fever and sore throat.  As we discussed you tested positive for strep.  I sent antibiotics to your pharmacy.  You can use ibuprofen and/or Tylenol as needed for pain or fever.  Continue to monitor how you're doing and return to the ER for new or worsening symptoms.

## 2022-06-12 DIAGNOSIS — Z1151 Encounter for screening for human papillomavirus (HPV): Secondary | ICD-10-CM | POA: Diagnosis not present

## 2022-06-12 DIAGNOSIS — Z113 Encounter for screening for infections with a predominantly sexual mode of transmission: Secondary | ICD-10-CM | POA: Diagnosis not present

## 2022-06-12 DIAGNOSIS — R3915 Urgency of urination: Secondary | ICD-10-CM | POA: Diagnosis not present

## 2022-06-12 DIAGNOSIS — N898 Other specified noninflammatory disorders of vagina: Secondary | ICD-10-CM | POA: Diagnosis not present

## 2022-06-12 DIAGNOSIS — Z01419 Encounter for gynecological examination (general) (routine) without abnormal findings: Secondary | ICD-10-CM | POA: Diagnosis not present

## 2022-06-12 DIAGNOSIS — N3946 Mixed incontinence: Secondary | ICD-10-CM | POA: Diagnosis not present

## 2022-06-12 DIAGNOSIS — Z124 Encounter for screening for malignant neoplasm of cervix: Secondary | ICD-10-CM | POA: Diagnosis not present

## 2022-08-12 DIAGNOSIS — A599 Trichomoniasis, unspecified: Secondary | ICD-10-CM | POA: Diagnosis not present

## 2022-08-12 DIAGNOSIS — N898 Other specified noninflammatory disorders of vagina: Secondary | ICD-10-CM | POA: Diagnosis not present

## 2022-10-07 DIAGNOSIS — F411 Generalized anxiety disorder: Secondary | ICD-10-CM | POA: Diagnosis not present

## 2022-10-07 DIAGNOSIS — E6609 Other obesity due to excess calories: Secondary | ICD-10-CM | POA: Diagnosis not present

## 2022-11-03 DIAGNOSIS — F411 Generalized anxiety disorder: Secondary | ICD-10-CM | POA: Diagnosis not present

## 2022-11-03 DIAGNOSIS — F4325 Adjustment disorder with mixed disturbance of emotions and conduct: Secondary | ICD-10-CM | POA: Diagnosis not present

## 2022-11-27 DIAGNOSIS — Z1389 Encounter for screening for other disorder: Secondary | ICD-10-CM | POA: Diagnosis not present

## 2022-11-27 DIAGNOSIS — F411 Generalized anxiety disorder: Secondary | ICD-10-CM | POA: Diagnosis not present

## 2023-01-08 DIAGNOSIS — Z1389 Encounter for screening for other disorder: Secondary | ICD-10-CM | POA: Diagnosis not present

## 2023-01-08 DIAGNOSIS — F411 Generalized anxiety disorder: Secondary | ICD-10-CM | POA: Diagnosis not present

## 2023-01-08 DIAGNOSIS — E669 Obesity, unspecified: Secondary | ICD-10-CM | POA: Diagnosis not present

## 2023-04-22 ENCOUNTER — Ambulatory Visit (HOSPITAL_COMMUNITY)

## 2023-04-22 DIAGNOSIS — H9201 Otalgia, right ear: Secondary | ICD-10-CM | POA: Diagnosis not present

## 2023-05-17 DIAGNOSIS — H9201 Otalgia, right ear: Secondary | ICD-10-CM | POA: Diagnosis not present

## 2023-05-17 DIAGNOSIS — M2669 Other specified disorders of temporomandibular joint: Secondary | ICD-10-CM | POA: Diagnosis not present

## 2023-06-25 ENCOUNTER — Emergency Department (HOSPITAL_COMMUNITY)
Admission: EM | Admit: 2023-06-25 | Discharge: 2023-06-25 | Disposition: A | Discharge status: Home / Self Care | Attending: Emergency Medicine | Admitting: Emergency Medicine

## 2023-06-25 ENCOUNTER — Encounter (HOSPITAL_COMMUNITY): Payer: Self-pay

## 2023-06-25 ENCOUNTER — Other Ambulatory Visit: Payer: Self-pay

## 2023-06-25 DIAGNOSIS — Z91040 Latex allergy status: Secondary | ICD-10-CM | POA: Insufficient documentation

## 2023-06-25 DIAGNOSIS — R220 Localized swelling, mass and lump, head: Secondary | ICD-10-CM | POA: Diagnosis not present

## 2023-06-25 DIAGNOSIS — T783XXA Angioneurotic edema, initial encounter: Secondary | ICD-10-CM | POA: Insufficient documentation

## 2023-06-25 MED ORDER — PREDNISONE 20 MG PO TABS
60.0000 mg | ORAL_TABLET | Freq: Once | ORAL | Status: AC
  Administered 2023-06-25: 60 mg via ORAL
  Filled 2023-06-25: qty 3

## 2023-06-25 MED ORDER — PREDNISONE 20 MG PO TABS: ORAL_TABLET | ORAL | 0 refills | Status: AC

## 2023-06-25 NOTE — ED Triage Notes (Addendum)
 Pt complaining of her lips swelling and the tip of her tongue hurts. Said she did change toothpaste and ate a small piece of fish last night and she woke up this morning with the swelling. Is answering questions without problems. No distress noted.

## 2023-06-25 NOTE — Discharge Instructions (Addendum)
 Please follow-up with your family doctor in the office.  If you do not have a family doctor then please try to establish with 1.  They may consider sending you off to an allergist or performing blood work in the office.  Take the steroids as prescribed.  Also take an antihistamine regularly for the next 24 to 48 hours.

## 2023-06-25 NOTE — ED Provider Notes (Signed)
 [place] EM[facility]GENCY DEPARTMENT AT [facility] Provider Note   CSN: [id number] Arrival date & time: 06/25/23  0636     History  Chief Complaint  Patient presents with   Oral Swelling    Brittney Mcfarland is a 33 y.o. female.  33 yo F with a cc of lip and tongue swelling.  Going on since about 4 am.  Has gotten progressively better after benadryl.  She thinks it maybe due to her toothpaste or maybe some lip gloss.  Had something similar about a week ago.  Deny any home meds.          Home Medications Prior to Admission medications   Medication Sig Start Date End Date Taking? Authorizing Provider  predniSONE  (DELTASONE ) 20 MG tablet 2 tabs po daily x 4 days 06/25/23  Yes Lajoy Vanamburg, DO  hydrocortisone cream 1 % Apply 1 application topically daily as needed (For rash on face.).    [provider]  Linoleic Acid-Sunflower Oil (CLA) [phone] MG CAPS Take 1 capsule by mouth 3 (three) times daily.    [provider]  metroNIDAZOLE  (FLAGYL ) 500 MG tablet Take 1 tablet (500 mg total) by mouth 2 (two) times daily. 10/07/15   Terri Fester, NP  promethazine  (PHENERGAN ) 25 MG tablet Take 0.5-1 tablets (12.5-25 mg total) by mouth every 6 (six) hours as needed. 12/03/15   Apolinar Baxter, CNM  fluticasone  (FLONASE ) 50 MCG/ACT nasal spray Place 2 sprays into both nostrils 2 (two) times daily. Decrease to 2 sprays/nostril daily after 5 days Patient not taking: Reported on 07/03/2014 05/09/13 07/03/14  Baker, Zachary H, PA-C  norgestimate -ethinyl estradiol  (ORTHO-CYCLEN,SPRINTEC,PREVIFEM) 0.25-35 MG-MCG tablet Take 1 tablet by mouth daily. Patient not taking: Reported on 07/03/2014 01/14/13 07/03/14  Sheril Dines, MD      Allergies    Latex    Review of Systems   Review of Systems  Physical Exam Updated Vital Signs BP 108/64   Pulse 85   Temp 98.3 F (36.8 C) (Oral)   Resp 16   Ht 5\' 3"  (1.6 m)   Wt 99.8 kg   LMP 05/30/2023   SpO2 100%   BMI 38.97 kg/m   Physical Exam Vitals and nursing note reviewed.  Constitutional:      General: She is not in acute distress.    Appearance: She is well-developed. She is not diaphoretic.  HENT:     Head: Normocephalic and atraumatic.     Mouth/Throat:     Comments: No obvious edema.  Posterior oropharynx without obvious erythema or edema.  Tolerating secretions without difficulty.  Eyes:     Pupils: Pupils are equal, round, and reactive to light.  Cardiovascular:     Rate and Rhythm: Normal rate and regular rhythm.     Heart sounds: No murmur heard.    No friction rub. No gallop.  Pulmonary:     Effort: Pulmonary effort is normal.     Breath sounds: No wheezing or rales.  Abdominal:     General: There is no distension.     Palpations: Abdomen is soft.     Tenderness: There is no abdominal tenderness.  Musculoskeletal:        General: No tenderness.     Cervical back: Normal range of motion and neck supple.  Skin:    General: Skin is warm and dry.  Neurological:     Mental Status: She is alert and oriented to person, place, and time.  Psychiatric:  Behavior: Behavior normal.     ED Results / Procedures / Treatments   Labs (all labs ordered are listed, but only abnormal results are displayed) Labs Reviewed - No data to display  EKG None  Radiology No results found.  Procedures Procedures    Medications Ordered in ED Medications  predniSONE  (DELTASONE ) tablet 60 mg (60 mg Oral Given 06/25/23 0800)    ED Course/ Medical Decision Making/ A&P                                 Medical Decision Making Risk Prescription drug management.   33 yo F with a cc of lip swelling.  Noticed this morning about 4am.  Patient with some improvement post benadryl.  No obvious edema on my exam.  Will start on steroids.  PCP follow up.  ? Angioedema vs contact allergy.  No obvious rash, n/v/d, hypotension, wheezing.   8:50 AM:  I have discussed the diagnosis/risks/treatment options  with the patient.  Evaluation and diagnostic testing in the emergency department does not suggest an emergent condition requiring admission or immediate intervention beyond what has been performed at this time.  They will follow up with PCP. We also discussed returning to the ED immediately if new or worsening sx occur. We discussed the sx which are most concerning (e.g., sudden worsening pain, fever, inability to tolerate by mouth, anaphylaxis) that necessitate immediate return. Medications administered to the patient during their visit and any new prescriptions provided to the patient are listed below.  Medications given during this visit Medications  predniSONE  (DELTASONE ) tablet 60 mg (60 mg Oral Given 06/25/23 0800)     The patient appears reasonably screen and/or stabilized for discharge and I doubt any other medical condition or other [facility] requiring further screening, evaluation, or treatment in the ED at this time prior to discharge.          Final Clinical Impression(s) / ED Diagnoses Final diagnoses:  Angioedema, initial encounter    Rx / DC Orders ED Discharge Orders          Ordered    predniSONE  (DELTASONE ) 20 MG tablet        06/25/23 0754              Albertus Hughs, DO 06/25/23 [phone]

## 2023-11-22 DIAGNOSIS — E669 Obesity, unspecified: Secondary | ICD-10-CM | POA: Diagnosis not present

## 2023-11-22 DIAGNOSIS — Z0000 Encounter for general adult medical examination without abnormal findings: Secondary | ICD-10-CM | POA: Diagnosis not present

## 2023-11-29 DIAGNOSIS — M542 Cervicalgia: Secondary | ICD-10-CM | POA: Diagnosis not present

## 2023-11-29 DIAGNOSIS — M25512 Pain in left shoulder: Secondary | ICD-10-CM | POA: Diagnosis not present

## 2023-12-10 DIAGNOSIS — M542 Cervicalgia: Secondary | ICD-10-CM | POA: Diagnosis not present

## 2023-12-16 DIAGNOSIS — M25512 Pain in left shoulder: Secondary | ICD-10-CM | POA: Diagnosis not present
